# Patient Record
Sex: Female | Born: 1950 | Race: White | Hispanic: No | Marital: Married | Smoking: Former smoker
Health system: Southern US, Community
[De-identification: ages and names within clinical notes are randomized; demographics above are authoritative.]

## PROBLEM LIST (undated history)

## (undated) DIAGNOSIS — E119 Type 2 diabetes mellitus without complications: Secondary | ICD-10-CM

## (undated) DIAGNOSIS — I10 Essential (primary) hypertension: Secondary | ICD-10-CM

## (undated) DIAGNOSIS — R600 Localized edema: Secondary | ICD-10-CM

## (undated) DIAGNOSIS — R609 Edema, unspecified: Secondary | ICD-10-CM

## (undated) DIAGNOSIS — G2581 Restless legs syndrome: Secondary | ICD-10-CM

## (undated) DIAGNOSIS — N189 Chronic kidney disease, unspecified: Secondary | ICD-10-CM

## (undated) DIAGNOSIS — T884XXA Failed or difficult intubation, initial encounter: Secondary | ICD-10-CM

## (undated) DIAGNOSIS — E669 Obesity, unspecified: Secondary | ICD-10-CM

## (undated) DIAGNOSIS — N1330 Unspecified hydronephrosis: Secondary | ICD-10-CM

## (undated) DIAGNOSIS — N200 Calculus of kidney: Principal | ICD-10-CM

## (undated) DIAGNOSIS — R3129 Other microscopic hematuria: Secondary | ICD-10-CM

## (undated) DIAGNOSIS — G4730 Sleep apnea, unspecified: Secondary | ICD-10-CM

## (undated) HISTORY — DX: Edema, unspecified: R60.9

## (undated) HISTORY — DX: Type 2 diabetes mellitus without complications: E11.9

## (undated) HISTORY — PX: BACK SURGERY: SHX140

## (undated) HISTORY — PX: OOPHORECTOMY: SHX6387

## (undated) HISTORY — DX: Obesity, unspecified: E66.9

## (undated) HISTORY — PX: BARIATRIC SURGERY: SHX1103

## (undated) HISTORY — DX: Other microscopic hematuria: R31.29

## (undated) HISTORY — DX: Unspecified hydronephrosis: N13.30

## (undated) HISTORY — DX: Calculus of kidney: N20.0

## (undated) HISTORY — DX: Sleep apnea, unspecified: G47.30

## (undated) HISTORY — PX: ABDOMINAL HYSTERECTOMY: SHX81

## (undated) HISTORY — PX: OTHER SURGICAL HISTORY: SHX169

---

## 2006-04-13 ENCOUNTER — Ambulatory Visit: Payer: Self-pay | Admitting: Family Medicine

## 2007-05-12 ENCOUNTER — Emergency Department: Payer: Self-pay | Admitting: Emergency Medicine

## 2007-05-13 ENCOUNTER — Ambulatory Visit: Payer: Self-pay | Admitting: Family Medicine

## 2008-02-03 ENCOUNTER — Other Ambulatory Visit: Payer: Self-pay

## 2008-02-03 ENCOUNTER — Emergency Department: Payer: Self-pay | Admitting: Internal Medicine

## 2008-02-12 ENCOUNTER — Other Ambulatory Visit: Payer: Self-pay

## 2008-02-12 ENCOUNTER — Observation Stay: Payer: Self-pay | Admitting: Specialist

## 2008-02-27 ENCOUNTER — Ambulatory Visit: Payer: Self-pay | Admitting: Family Medicine

## 2009-03-06 ENCOUNTER — Emergency Department: Payer: Self-pay | Admitting: Emergency Medicine

## 2009-08-26 ENCOUNTER — Ambulatory Visit: Payer: Self-pay | Admitting: Internal Medicine

## 2009-09-04 ENCOUNTER — Ambulatory Visit: Payer: Self-pay | Admitting: Urology

## 2010-01-15 ENCOUNTER — Ambulatory Visit: Payer: Self-pay | Admitting: Family Medicine

## 2011-02-17 ENCOUNTER — Ambulatory Visit: Payer: Self-pay | Admitting: Otolaryngology

## 2011-04-07 ENCOUNTER — Ambulatory Visit: Payer: Self-pay | Admitting: Family Medicine

## 2011-07-26 ENCOUNTER — Ambulatory Visit: Payer: Self-pay | Admitting: Unknown Physician Specialty

## 2011-12-21 ENCOUNTER — Encounter: Payer: Self-pay | Admitting: Nurse Practitioner

## 2011-12-21 ENCOUNTER — Encounter: Payer: Self-pay | Admitting: Cardiothoracic Surgery

## 2011-12-29 ENCOUNTER — Encounter: Payer: Self-pay | Admitting: Cardiothoracic Surgery

## 2011-12-29 ENCOUNTER — Encounter: Payer: Self-pay | Admitting: Nurse Practitioner

## 2012-01-29 ENCOUNTER — Encounter: Payer: Self-pay | Admitting: Cardiothoracic Surgery

## 2012-01-29 ENCOUNTER — Encounter: Payer: Self-pay | Admitting: Nurse Practitioner

## 2012-03-22 ENCOUNTER — Ambulatory Visit: Payer: Self-pay | Admitting: Bariatrics

## 2012-03-22 DIAGNOSIS — Z01810 Encounter for preprocedural cardiovascular examination: Secondary | ICD-10-CM

## 2012-04-19 ENCOUNTER — Ambulatory Visit: Payer: Self-pay | Admitting: Internal Medicine

## 2012-04-21 ENCOUNTER — Ambulatory Visit: Payer: Self-pay | Admitting: Bariatrics

## 2012-04-30 ENCOUNTER — Ambulatory Visit: Payer: Self-pay | Admitting: Bariatrics

## 2012-06-06 ENCOUNTER — Ambulatory Visit: Payer: Self-pay | Admitting: Bariatrics

## 2012-06-13 ENCOUNTER — Inpatient Hospital Stay: Payer: Self-pay | Admitting: Bariatrics

## 2012-06-14 LAB — CBC WITH DIFFERENTIAL/PLATELET
Basophil #: 0 10*3/uL (ref 0.0–0.1)
Basophil %: 0.5 %
Eosinophil #: 0 10*3/uL (ref 0.0–0.7)
Lymphocyte %: 6.3 %
MCH: 29.5 pg (ref 26.0–34.0)
MCHC: 34.1 g/dL (ref 32.0–36.0)
MCV: 87 fL (ref 80–100)
Monocyte #: 0.7 x10 3/mm (ref 0.2–0.9)
Neutrophil %: 86 %
Platelet: 202 10*3/uL (ref 150–440)
RBC: 4.44 10*6/uL (ref 3.80–5.20)
RDW: 14.1 % (ref 11.5–14.5)

## 2012-06-14 LAB — BASIC METABOLIC PANEL
Anion Gap: 8 (ref 7–16)
BUN: 12 mg/dL (ref 7–18)
Calcium, Total: 8.3 mg/dL — ABNORMAL LOW (ref 8.5–10.1)
Chloride: 108 mmol/L — ABNORMAL HIGH (ref 98–107)
Co2: 26 mmol/L (ref 21–32)
Creatinine: 0.74 mg/dL (ref 0.60–1.30)
EGFR (African American): >60
Osmolality: 284 (ref 275–301)

## 2012-06-19 LAB — PATHOLOGY REPORT

## 2012-07-06 ENCOUNTER — Ambulatory Visit: Payer: Self-pay | Admitting: Bariatrics

## 2012-07-30 ENCOUNTER — Ambulatory Visit: Payer: Self-pay | Admitting: Bariatrics

## 2013-01-12 DIAGNOSIS — R3129 Other microscopic hematuria: Secondary | ICD-10-CM

## 2013-01-12 HISTORY — DX: Other microscopic hematuria: R31.29

## 2013-01-25 DIAGNOSIS — N200 Calculus of kidney: Secondary | ICD-10-CM | POA: Insufficient documentation

## 2013-01-25 HISTORY — DX: Calculus of kidney: N20.0

## 2013-06-20 ENCOUNTER — Ambulatory Visit: Payer: Self-pay | Admitting: Internal Medicine

## 2014-03-24 DIAGNOSIS — E119 Type 2 diabetes mellitus without complications: Secondary | ICD-10-CM | POA: Insufficient documentation

## 2014-03-24 DIAGNOSIS — E1169 Type 2 diabetes mellitus with other specified complication: Secondary | ICD-10-CM | POA: Insufficient documentation

## 2014-03-24 DIAGNOSIS — I10 Essential (primary) hypertension: Secondary | ICD-10-CM | POA: Insufficient documentation

## 2014-03-24 DIAGNOSIS — R609 Edema, unspecified: Secondary | ICD-10-CM | POA: Insufficient documentation

## 2014-03-24 DIAGNOSIS — E118 Type 2 diabetes mellitus with unspecified complications: Secondary | ICD-10-CM | POA: Insufficient documentation

## 2014-03-24 DIAGNOSIS — E669 Obesity, unspecified: Secondary | ICD-10-CM | POA: Insufficient documentation

## 2014-06-09 DIAGNOSIS — E668 Other obesity: Secondary | ICD-10-CM | POA: Insufficient documentation

## 2014-08-13 ENCOUNTER — Ambulatory Visit: Payer: Self-pay | Admitting: Internal Medicine

## 2014-10-02 ENCOUNTER — Telehealth: Payer: Self-pay | Admitting: Neurology

## 2014-12-12 DIAGNOSIS — G2581 Restless legs syndrome: Secondary | ICD-10-CM | POA: Insufficient documentation

## 2014-12-13 ENCOUNTER — Ambulatory Visit
Admit: 2014-12-13 | Disposition: A | Discharge status: Home / Self Care | Payer: Self-pay | Attending: Internal Medicine | Admitting: Internal Medicine

## 2014-12-17 NOTE — Op Note (Signed)
PATIENT NAME:  Laurie Dickson, Laurie Dickson MR#:  [id number] DATE OF BIRTH:  1951-08-13  DATE OF PROCEDURE:  06/13/2012  PROCEDURE PERFORMED: Laparoscopic gastric bypass with repair of hiatal hernia, intraoperative endoscopy.   PREOPERATIVE DIAGNOSIS: Morbid obesity with a BMI of 52 associated with hypertension and diabetes mellitus, hyperlipidemia, and presence of a hiatal hernia and reflux as noted on upper GI series.   POSTOPERATIVE DIAGNOSIS: Morbid obesity with a BMI of 52 associated with hypertension and diabetes mellitus, hyperlipidemia, and presence of a hiatal hernia and reflux as noted on upper GI series, 2-cm hiatal hernia.   SURGEON: Tyrone Apple. Alva Garnet, MD  ASSISTANT: Sanjuana Kava, PA   DESCRIPTION OF PROCEDURE: The patient was brought to the operating room and placed in the supine position. General anesthesia was obtained with orotracheal intubation. The abdomen was sterilely prepped and draped after a Foley catheter was inserted. TED hose and Thromboguards were applied and a foot board applied at the end of the operative bed. After prep and drape of the abdomen, a 5-mm Optiview trocar was introduced under direct visualization in the left upper quadrant of the abdomen. Pneumoperitoneum was obtained with carbon dioxide. Four additional trocars were introduced across the upper abdomen and the omentum elevated out of the lower abdomen and the ligament of Treitz identified. The bowel was followed distally 50 cm at which point it was divided with a white load GIA stapler. The arcade vessels were divided by use of the Harmonic scalpel. The patient then had the bowel followed an additional 100 cm at which point a side-to-side jejunal/jejunal anastomosis was created.  This was accomplished with enterotomies on the antimesenteric border of the biliary limb and the common channel portion of jejunum. A white load stapler was used to create a common lumen followed by closure of the resulting enterotomy with a repeat  firing of white load GIA stapler. Anti-torsion sutures were placed distal to the anastomosis and the mesenteric window closed with a running 2-0 Ethibond suture. The transverse colon omentum was then divided by use of the Harmonic scalpel and the mobilized Roux limb was secured to the anterior stomach. A Nathanson liver retractor was introduced through a subxiphoid wound and with the patient placed in a steep Trendelenburg position, the region in the hiatus identified. The patient was with a large fat pad in this region. It was mobilized away from the undersurface of the left hemidiaphragm and the anterior aspect of the hiatus. There was noted to be a generous amount of fat herniating through the hiatus. Portions of the gastrohepatic ligament were divided by use of the Harmonic scalpel. The peritoneum along the right crus was incised by use of the Harmonic scalpel. Next, blunt dissection was used to reduce both the herniated lesser sac fatty tissue and the herniated peritoneum anteriorly. In addition to this, a generous portion of fat was reduced from the lower mediastinum; portions of this were excised from the anterior upper stomach. The patient then had circumferential dissection of the esophagus and lower mediastinum sweeping away from the overlying pericardium, the aorta, and the pleural surfaces. This dissection was extended circumferentially into the lower mediastinum over a distance of approximately 5 to 6 cm. This ultimately resulted in delivery of approximately 1.5 cm of esophagus lying comfortably in the abdominal cavity. Next, a posterior crural repair was performed. With the third suture there was noted to be increased tension on the diaphragmatic fascia and for that reason a relaxing incision in right hemidiaphragm pursued. This was accomplished  with division of the thickened fascia at a point midway between the vena cava and the upper apex of the right crus. This led to identification of the  underlying peripleural fat pad. The release was extended in a curvilinear fashion superiorly to a point in which the fascia was noted to approximate the pericardium. This resulted in an iatrogenic defect noted to measure approximately 3.5 to 4 cm in length.  It broadened to a diameter of approximately 1.5 cm and resulted in a significant reduction of tension across the hiatal repair. Because the left lobe of the liver was overlying this area, it was felt that coverage of this should not be necessary. The patient then had division of the arcade vessels along the lesser curvature of the stomach beginning approximately 5 cm inferior to the GE junction. This was accomplished with use of the Harmonic scalpel. Next, a transverse gold load staple with Seamguard was used to initiate creation of a proximal gastric pouch. This was placed in a transverse direction. Next, a vertical line of staples was brought out just lateral to the angle of His. The patient then had a distal posterior gastrojejunal anastomosis accomplished. This was accomplished with enterotomy on the distal posterior aspect of the gastric pouch and opposing portion of the mobilized Roux limb. A small candy-cane was left to reduce tension on the mesentery. At this point, the patient had a blue load stapler used to create a common lumen with the stapler being fired at approximately the 2-cm mark. The resultant enterotomy was closed with a running 2-0 Vicryl suture while a 34 French bougie was then placed, used as an aid in sizing of the anastomotic lumen. The suture and staple lines were reinforced with additional running 2-0 Vicryl suture. With the bowel occluded distal to the anastomosis, an upper endoscopy was performed. There was no evidence of a leak noted with a saline bath. No bleeding was appreciated in the gastric pouch and/or the anastomotic area. The scope was withdrawn. An omental patch was placed over the area of the anastomosis with use of the  two prior divided limbs of the omentum. These were secured to the jejunum immediately distal to the anastomosis. Peterson defect was closed with a running 2-0 Ethibond suture. The jejunal-jejunal anastomVonita Mossotic area was inspected and noted to have excellent hemostasis.   At this point the pneumoperitoneum was relieved. The trocars were removed. The wounds were managed with 0.25% Marcaine with epinephrine, 4-0 Monocryl in the dermis followed by Dermabond.   ESTIMATED BLOOD LOSS: 25 mL or less.     ____________________________ Tyrone AppleMichael A. Alva Garnetyner, MD mat:bjt D: 06/13/2012 11:46:27 ET T: 06/13/2012 12:28:42 ET JOB#: [id number]  cc: Casimiro NeedleMichael A. Alva Garnetyner, MD, <Dictator> Marya AmslerMarshall W. Dareen PianoAnderson, MD Everette RankMICHAEL A Aaima Gaddie MD ELECTRONICALLY SIGNED 06/14/2012 8:25

## 2014-12-25 ENCOUNTER — Ambulatory Visit
Admit: 2014-12-25 | Disposition: A | Discharge status: Home / Self Care | Payer: Self-pay | Attending: Urology | Admitting: Urology

## 2015-01-15 ENCOUNTER — Encounter
Admission: RE | Admit: 2015-01-15 | Discharge: 2015-01-15 | Disposition: A | Discharge status: Home / Self Care | Payer: 59 | Source: Ambulatory Visit | Attending: Urology | Admitting: Urology

## 2015-01-15 ENCOUNTER — Encounter: Payer: Self-pay | Admitting: *Deleted

## 2015-01-15 DIAGNOSIS — E1129 Type 2 diabetes mellitus with other diabetic kidney complication: Secondary | ICD-10-CM | POA: Diagnosis not present

## 2015-01-15 DIAGNOSIS — Z9884 Bariatric surgery status: Secondary | ICD-10-CM | POA: Diagnosis not present

## 2015-01-15 DIAGNOSIS — G2581 Restless legs syndrome: Secondary | ICD-10-CM | POA: Diagnosis not present

## 2015-01-15 DIAGNOSIS — N189 Chronic kidney disease, unspecified: Secondary | ICD-10-CM | POA: Diagnosis not present

## 2015-01-15 DIAGNOSIS — Z79899 Other long term (current) drug therapy: Secondary | ICD-10-CM | POA: Diagnosis not present

## 2015-01-15 DIAGNOSIS — Z9889 Other specified postprocedural states: Secondary | ICD-10-CM | POA: Diagnosis not present

## 2015-01-15 DIAGNOSIS — F329 Major depressive disorder, single episode, unspecified: Secondary | ICD-10-CM | POA: Diagnosis not present

## 2015-01-15 DIAGNOSIS — N201 Calculus of ureter: Secondary | ICD-10-CM | POA: Diagnosis not present

## 2015-01-15 DIAGNOSIS — N200 Calculus of kidney: Secondary | ICD-10-CM | POA: Insufficient documentation

## 2015-01-15 DIAGNOSIS — Z87891 Personal history of nicotine dependence: Secondary | ICD-10-CM | POA: Diagnosis not present

## 2015-01-15 DIAGNOSIS — N211 Calculus in urethra: Secondary | ICD-10-CM | POA: Diagnosis not present

## 2015-01-15 DIAGNOSIS — Z01812 Encounter for preprocedural laboratory examination: Secondary | ICD-10-CM | POA: Insufficient documentation

## 2015-01-15 DIAGNOSIS — Z882 Allergy status to sulfonamides status: Secondary | ICD-10-CM | POA: Diagnosis not present

## 2015-01-15 DIAGNOSIS — I129 Hypertensive chronic kidney disease with stage 1 through stage 4 chronic kidney disease, or unspecified chronic kidney disease: Secondary | ICD-10-CM | POA: Diagnosis not present

## 2015-01-15 DIAGNOSIS — Z90710 Acquired absence of both cervix and uterus: Secondary | ICD-10-CM | POA: Diagnosis not present

## 2015-01-15 LAB — BASIC METABOLIC PANEL
ANION GAP: 6 (ref 5–15)
BUN: 21 mg/dL — ABNORMAL HIGH (ref 6–20)
CO2: 28 mmol/L (ref 22–32)
Calcium: 8.8 mg/dL — ABNORMAL LOW (ref 8.9–10.3)
Chloride: 106 mmol/L (ref 101–111)
Creatinine, Ser: 0.62 mg/dL (ref 0.44–1.00)
GFR calc Af Amer: >60 mL/min (ref >60)
Glucose, Bld: 87 mg/dL (ref 65–99)
POTASSIUM: 4.7 mmol/L (ref 3.5–5.1)
SODIUM: 140 mmol/L (ref 135–145)

## 2015-01-15 LAB — CBC
HCT: 38.2 % (ref 35.0–47.0)
Hemoglobin: 12.5 g/dL (ref 12.0–16.0)
MCH: 28.8 pg (ref 26.0–34.0)
MCHC: 32.6 g/dL (ref 32.0–36.0)
MCV: 88.4 fL (ref 80.0–100.0)
PLATELETS: 173 10*3/uL (ref 150–440)
RBC: 4.32 MIL/uL (ref 3.80–5.20)
RDW: 14.2 % (ref 11.5–14.5)
WBC: 4.9 10*3/uL (ref 3.6–11.0)

## 2015-01-15 NOTE — Patient Instructions (Signed)
  Your procedure is scheduled on: 01/20/15 Report to Day Surgery. To find out your arrival time please call [phone] between 1PM - 3PM on 01/17/15.  Remember: Instructions that are not followed completely may result in serious medical risk, up to and including death, or upon the discretion of your surgeon and anesthesiologist your surgery may need to be rescheduled.    _x___ 1. Do not eat food or drink liquids after midnight. No gum chewing or hard candies.     __x__ 2. No Alcohol for 24 hours before or after surgery.   ____ 3. Bring all medications with you on the day of surgery if instructed.    __x__ 4. Notify your doctor if there is any change in your medical condition     (cold, fever, infections).     Do not wear jewelry, make-up, hairpins, clips or nail polish.  Do not wear lotions, powders, or perfumes. You may wear deodorant.  Do not shave 48 hours prior to surgery. Men may shave face and neck.  Do not bring valuables to the hospital.    [facility] is [facility] responsible for any belongings or valuables.               Contacts, dentures or bridgework may not be worn into surgery.  Leave your suitcase in the car. After surgery it may be brought to your room.  For patients admitted to the hospital, discharge time is determined by your                treatment team.   Patients discharged the day of surgery will not be allowed to drive home.   Please read over the following fact sheets that you were given:   Surgical Site Infection Prevention   ____ Take these medicines the morning of surgery with A SIP OF WATER:    1.   2.   3.   4.  5.  6.  ____ Fleet Enema (as directed)   ____ Use CHG Soap as directed  ____ Use inhalers on the day of surgery  ____ Stop metformin 2 days prior to surgery    ____ Take 1/2 of usual insulin dose the night before surgery and none on the morning of surgery.   ____ Stop Coumadin/Plavix/aspirin on   ____ Stop Anti-inflammatories on     ____ Stop supplements until after surgery.    ____ Bring C-Pap to the hospital.

## 2015-01-20 ENCOUNTER — Encounter
Admission: RE | Disposition: A | Discharge status: Home / Self Care | Payer: Self-pay | Source: Ambulatory Visit | Attending: Urology

## 2015-01-20 ENCOUNTER — Ambulatory Visit: Payer: 59 | Admitting: Anesthesiology

## 2015-01-20 ENCOUNTER — Ambulatory Visit
Admission: RE | Admit: 2015-01-20 | Discharge: 2015-01-20 | Disposition: A | Discharge status: Home / Self Care | Payer: 59 | Source: Ambulatory Visit | Attending: Urology | Admitting: Urology

## 2015-01-20 ENCOUNTER — Encounter: Payer: Self-pay | Admitting: Anesthesiology

## 2015-01-20 DIAGNOSIS — N189 Chronic kidney disease, unspecified: Secondary | ICD-10-CM | POA: Insufficient documentation

## 2015-01-20 DIAGNOSIS — Z90710 Acquired absence of both cervix and uterus: Secondary | ICD-10-CM | POA: Insufficient documentation

## 2015-01-20 DIAGNOSIS — Z79899 Other long term (current) drug therapy: Secondary | ICD-10-CM | POA: Insufficient documentation

## 2015-01-20 DIAGNOSIS — Z9884 Bariatric surgery status: Secondary | ICD-10-CM | POA: Insufficient documentation

## 2015-01-20 DIAGNOSIS — F329 Major depressive disorder, single episode, unspecified: Secondary | ICD-10-CM | POA: Insufficient documentation

## 2015-01-20 DIAGNOSIS — N201 Calculus of ureter: Secondary | ICD-10-CM | POA: Insufficient documentation

## 2015-01-20 DIAGNOSIS — E1129 Type 2 diabetes mellitus with other diabetic kidney complication: Secondary | ICD-10-CM | POA: Insufficient documentation

## 2015-01-20 DIAGNOSIS — Z9889 Other specified postprocedural states: Secondary | ICD-10-CM | POA: Insufficient documentation

## 2015-01-20 DIAGNOSIS — I129 Hypertensive chronic kidney disease with stage 1 through stage 4 chronic kidney disease, or unspecified chronic kidney disease: Secondary | ICD-10-CM | POA: Insufficient documentation

## 2015-01-20 DIAGNOSIS — Z882 Allergy status to sulfonamides status: Secondary | ICD-10-CM | POA: Insufficient documentation

## 2015-01-20 DIAGNOSIS — Z87891 Personal history of nicotine dependence: Secondary | ICD-10-CM | POA: Insufficient documentation

## 2015-01-20 DIAGNOSIS — G2581 Restless legs syndrome: Secondary | ICD-10-CM | POA: Insufficient documentation

## 2015-01-20 HISTORY — DX: Chronic kidney disease, unspecified: N18.9

## 2015-01-20 HISTORY — PX: CYSTOSCOPY W/ RETROGRADES: SHX1426

## 2015-01-20 HISTORY — DX: Localized edema: R60.0

## 2015-01-20 HISTORY — DX: Type 2 diabetes mellitus without complications: E11.9

## 2015-01-20 HISTORY — PX: CYSTOSCOPY WITH STENT PLACEMENT: SHX5790

## 2015-01-20 HISTORY — DX: Essential (primary) hypertension: I10

## 2015-01-20 HISTORY — PX: URETEROSCOPY WITH HOLMIUM LASER LITHOTRIPSY: SHX6645

## 2015-01-20 HISTORY — DX: Restless legs syndrome: G25.81

## 2015-01-20 SURGERY — URETEROSCOPY, WITH LITHOTRIPSY USING HOLMIUM LASER
Anesthesia: General

## 2015-01-20 MED ORDER — GLYCOPYRROLATE 0.2 MG/ML IJ SOLN
INTRAMUSCULAR | Status: DC | PRN
  Administered 2015-01-20: 0.2 mg via INTRAVENOUS

## 2015-01-20 MED ORDER — PROPOFOL 10 MG/ML IV BOLUS
INTRAVENOUS | Status: DC | PRN
  Administered 2015-01-20: 200 mg via INTRAVENOUS

## 2015-01-20 MED ORDER — ONDANSETRON HCL 4 MG/2ML IJ SOLN
INTRAMUSCULAR | Status: DC | PRN
  Administered 2015-01-20: 4 mg via INTRAVENOUS

## 2015-01-20 MED ORDER — OXYCODONE-ACETAMINOPHEN 5-325 MG PO TABS: 1.0000 | ORAL_TABLET | Freq: Four times a day (QID) | ORAL | Status: DC | PRN

## 2015-01-20 MED ORDER — FENTANYL CITRATE (PF) 100 MCG/2ML IJ SOLN
INTRAMUSCULAR | Status: DC | PRN
  Administered 2015-01-20: 50 ug via INTRAVENOUS

## 2015-01-20 MED ORDER — EPHEDRINE SULFATE 50 MG/ML IJ SOLN
INTRAMUSCULAR | Status: DC | PRN
  Administered 2015-01-20 (×2): 5 mg via INTRAVENOUS

## 2015-01-20 MED ORDER — LIDOCAINE HCL (CARDIAC) 20 MG/ML IV SOLN
INTRAVENOUS | Status: DC | PRN
  Administered 2015-01-20: 50 mg via INTRAVENOUS

## 2015-01-20 MED ORDER — FENTANYL CITRATE (PF) 100 MCG/2ML IJ SOLN: 25.0000 ug | INTRAMUSCULAR | Status: DC | PRN

## 2015-01-20 MED ORDER — BUPIVACAINE HCL (PF) 0.5 % IJ SOLN
INTRAMUSCULAR | Status: AC
  Filled 2015-01-20: qty 30

## 2015-01-20 MED ORDER — MIDAZOLAM HCL 2 MG/2ML IJ SOLN
INTRAMUSCULAR | Status: DC | PRN
  Administered 2015-01-20: 2 mg via INTRAVENOUS

## 2015-01-20 MED ORDER — IOTHALAMATE MEGLUMINE 43 % IV SOLN
INTRAVENOUS | Status: DC | PRN
  Administered 2015-01-20: 1 g via RECTAL

## 2015-01-20 MED ORDER — CEFAZOLIN SODIUM 1-5 GM-% IV SOLN
1.0000 g | Freq: Once | INTRAVENOUS | Status: AC
  Administered 2015-01-20: 1 g via INTRAVENOUS

## 2015-01-20 MED ORDER — ONDANSETRON HCL 4 MG/2ML IJ SOLN: 4.0000 mg | Freq: Once | INTRAMUSCULAR | Status: DC | PRN

## 2015-01-20 MED ORDER — BELLADONNA ALKALOIDS-OPIUM 16.2-60 MG RE SUPP
RECTAL | Status: AC
  Filled 2015-01-20: qty 1

## 2015-01-20 MED ORDER — LACTATED RINGERS IV SOLN
INTRAVENOUS | Status: DC
  Administered 2015-01-20 (×2): via INTRAVENOUS

## 2015-01-20 MED ORDER — CEFAZOLIN SODIUM 1-5 GM-% IV SOLN
INTRAVENOUS | Status: AC
  Administered 2015-01-20: 1 g via INTRAVENOUS
  Filled 2015-01-20: qty 50

## 2015-01-20 SURGICAL SUPPLY — 29 items
BAG DRAIN CYSTO-URO LG1000N (MISCELLANEOUS) ×4 IMPLANT
CATH URETL 5X70 OPEN END (CATHETERS) ×4 IMPLANT
CNTNR SPEC 2.5X3XGRAD LEK (MISCELLANEOUS) ×2
CONRAY 43 FOR UROLOGY 50M (MISCELLANEOUS) ×4 IMPLANT
CONT SPEC 4OZ STER OR WHT (MISCELLANEOUS) ×2
CONTAINER SPEC 2.5X3XGRAD LEK (MISCELLANEOUS) ×2 IMPLANT
FEE TECHNICIAN ONLY PER HOUR (MISCELLANEOUS) ×4 IMPLANT
GLOVE BIO SURGEON STRL SZ7 (GLOVE) ×8 IMPLANT
GLOVE BIO SURGEON STRL SZ7.5 (GLOVE) ×4 IMPLANT
GOWN STRL REUS W/ TWL LRG LVL3 (GOWN DISPOSABLE) ×2 IMPLANT
GOWN STRL REUS W/ TWL XL LVL3 (GOWN DISPOSABLE) ×2 IMPLANT
GOWN STRL REUS W/TWL LRG LVL3 (GOWN DISPOSABLE) ×2
GOWN STRL REUS W/TWL XL LVL3 (GOWN DISPOSABLE) ×2
GUIDEWIRE STR ZIPWIRE 035X150 (MISCELLANEOUS) ×4 IMPLANT
INTRODUCER DILATOR DOUBLE (INTRODUCER) ×4 IMPLANT
JELLY LUB 2OZ STRL (MISCELLANEOUS) ×2
JELLY LUBE 2OZ STRL (MISCELLANEOUS) ×2 IMPLANT
KIT RM TURNOVER CYSTO AR (KITS) ×4 IMPLANT
PACK CYSTO AR (MISCELLANEOUS) ×4 IMPLANT
PREP PVP WINGED SPONGE (MISCELLANEOUS) ×4 IMPLANT
SENSORWIRE 0.038 NOT ANGLED (WIRE) ×4
SET CYSTO W/LG BORE CLAMP LF (SET/KITS/TRAYS/PACK) ×4 IMPLANT
SOL .9 NS 3000ML IRR  AL (IV SOLUTION) ×2
SOL .9 NS 3000ML IRR UROMATIC (IV SOLUTION) ×2 IMPLANT
SOL PREP PVP 2OZ (MISCELLANEOUS) ×4
SOLUTION PREP PVP 2OZ (MISCELLANEOUS) ×2 IMPLANT
STENT URET 6FRX24 CONTOUR (STENTS) ×4 IMPLANT
WATER STERILE IRR 1000ML POUR (IV SOLUTION) ×4 IMPLANT
WIRE SENSOR 0.038 NOT ANGLED (WIRE) ×2 IMPLANT

## 2015-01-20 NOTE — Anesthesia Preprocedure Evaluation (Signed)
Anesthesia Evaluation    Airway Mallampati: II  TM Distance: >3 FB     Dental  (+) Chipped, Poor Dentition   Pulmonary former smoker,          Cardiovascular hypertension,     Neuro/Psych    GI/Hepatic   Endo/Other  diabetes  Renal/GU      Musculoskeletal   Abdominal   Peds  Hematology   Anesthesia Other Findings Several missing teeth.  Reproductive/Obstetrics                             Anesthesia Physical Anesthesia Plan  ASA: III  Anesthesia Plan: General   Post-op Pain Management:    Induction: Intravenous  Airway Management Planned: LMA  Additional Equipment:   Intra-op Plan:   Post-operative Plan:   Informed Consent:   Plan Discussed with: CRNA  Anesthesia Plan Comments:         Anesthesia Quick Evaluation

## 2015-01-20 NOTE — Transfer of Care (Signed)
Immediate Anesthesia Transfer of Care Note  Patient: Evalee MuttonCathy S Saxer  Procedure(s) Performed: Procedure(s): URETEROSCOPY WITH HOLMIUM LASER LITHOTRIPSY (Left) CYSTOSCOPY WITH RETROGRADE PYELOGRAM (Left) CYSTOSCOPY WITH STENT PLACEMENT (N/A)  Patient Location: PACU  Anesthesia Type:General  Level of Consciousness: Alert, Awake, Oriented  Airway & Oxygen Therapy: Patient Spontanous Breathing  Post-op Assessment: Report given to RN  Post vital signs: Reviewed and stable  Last Vitals:  Filed Vitals:   01/20/15 1145  BP: 138/73  Pulse: 61  Temp: 36.4 C  Resp: 12    Complications: No apparent anesthesia complications

## 2015-01-20 NOTE — Anesthesia Procedure Notes (Signed)
Procedure Name: LMA Insertion Date/Time: 01/20/2015 11:15 AM Performed by: Sherron FlemingsHARVEY, Parnika Tweten Pre-anesthesia Checklist: Patient identified, Patient being monitored, Timeout performed, Emergency Drugs available and Suction available Patient Re-evaluated:Patient Re-evaluated prior to inductionOxygen Delivery Method: Circle system utilized Preoxygenation: Pre-oxygenation with 100% oxygen Intubation Type: IV induction Ventilation: Mask ventilation without difficulty LMA: LMA inserted LMA Size: 3.0 Tube type: Oral Number of attempts: 1 Placement Confirmation: positive ETCO2 and breath sounds checked- equal and bilateral Tube secured with: Tape Dental Injury: Teeth and Oropharynx as per pre-operative assessment

## 2015-01-20 NOTE — H&P (Signed)
Urology Admission H&P  Chief Complaint:   History of Present Illness:   Past Medical History  Diagnosis Date  . RLS (restless legs syndrome)   . Depression   . Hypertension   . Chronic kidney disease     stones,hydronephrosis  . Edema extremities   . Diabetes mellitus without complication     Resolved since bariatric surgery   Past Surgical History  Procedure Laterality Date  . Abdominal hysterectomy    . Back surgery    . Bariatric surgery    . Oophorectomy    . Salpingectomy      Home Medications:  Prescriptions prior to admission  Medication Sig Dispense Refill Last Dose  . rOPINIRole (REQUIP) 0.5 MG tablet Take 0.5 mg by mouth once. 3 tabs hs   01/19/2015 at Unknown time   Allergies:  Allergies  Allergen Reactions  . Sulfa Antibiotics     History reviewed. No pertinent family history. Social History:  reports that she quit smoking about 4 years ago. She does not have any smokeless tobacco history on file. She reports that she does not drink alcohol. Her drug history is not on file.  ROS  Physical Exam:  Vital signs in last 24 hours: Temp:  [97.9 F (36.6 C)] 97.9 F (36.6 C) (05/23 1012) Resp:  [16] 16 (05/23 1012) BP: (184)/(74) 184/74 mmHg (05/23 1012) SpO2:  [100 %] 100 % (05/23 1012) Weight:  [91.173 kg (201 lb)] 91.173 kg (201 lb) (05/23 1012) Physical ExamLungs cta Heart sounds RRR  Laboratory Data:  No results found for this or any previous visit (from the past 24 hour(s)). No results found for this or any previous visit (from the past 240 hour(s)). Creatinine:  Recent Labs  01/15/15 1551  CREATININE 0.62   Baseline Creatinine:   Impression/Assessment  Left ureteral and renal calculi  Plan left ureteroscopy and laser lithotripsy  Plan:   Lorraine LaxHart, Richard D 01/20/2015, 10:39 AM

## 2015-01-20 NOTE — Anesthesia Postprocedure Evaluation (Signed)
  Anesthesia Post-op Note  Patient: Laurie Dickson  Procedure(s) Performed: Procedure(s): URETEROSCOPY WITH HOLMIUM LASER LITHOTRIPSY (Left) CYSTOSCOPY WITH RETROGRADE PYELOGRAM (Left) CYSTOSCOPY WITH STENT PLACEMENT (N/A)  Anesthesia type:General  Patient location: PACU  Post pain: Pain level controlled  Post assessment: Post-op Vital signs reviewed, Patient's Cardiovascular Status Stable, Respiratory Function Stable, Patent Airway and No signs of Nausea or vomiting  Post vital signs: Reviewed and stable  Last Vitals:  Filed Vitals:   01/20/15 1215  BP:   Pulse: 54  Temp:   Resp: 16    Level of consciousness: awake, alert  and patient cooperative  Complications: No apparent anesthesia complications

## 2015-01-20 NOTE — Discharge Instructions (Signed)
AMBULATORY SURGERY  DISCHARGE INSTRUCTIONS   1) The drugs that you were given will stay in your system until tomorrow so for the next 24 hours you should not:  A) Drive an automobile B) Make any legal decisions C) Drink any alcoholic beverage   2) You may resume regular meals tomorrow.  Today it is better to start with liquids and gradually work up to solid foods.  You may eat anything you prefer, but it is better to start with liquids, then soup and crackers, and gradually work up to solid foods.   3) Please notify your doctor immediately if you have any unusual bleeding, trouble breathing, redness and pain at the surgery site, drainage, fever, or pain not relieved by medication. 4)   5) Your post-operative visit with Dr.    Druscilla Brownie                 Cystoscopy is a procedure that is used to help your caregiver diagnose and sometimes treat conditions that affect your lower urinary tract. Your lower urinary tract includes your bladder and the tube through which urine passes from your bladder out of your body (urethra). Cystoscopy is performed with a thin, tube-shaped instrument (cystoscope). The cystoscope has lenses and a light at the end so that your caregiver can see inside your bladder. The cystoscope is inserted at the entrance of your urethra. Your caregiver guides it through your urethra and into your bladder. There are two main types of cystoscopy:  Flexible cystoscopy (with a flexible cystoscope).  Rigid cystoscopy (with a rigid cystoscope). Cystoscopy may be recommended for many conditions, including:  Urinary tract infections.  Blood in your urine (hematuria).  Loss of bladder control (urinary incontinence) or overactive bladder.  Unusual cells found in a urine sample.  Urinary blockage.  Painful urination. Cystoscopy may also be done to remove a sample of your tissue to be checked under a microscope (biopsy). It may also be done to remove or destroy bladder  stones. LET YOUR CAREGIVER KNOW ABOUT:  Allergies to food or medicine.  Medicines taken, including vitamins, herbs, eyedrops, over-the-counter medicines, and creams.  Use of steroids (by mouth or creams).  Previous problems with anesthetics or numbing medicines.  History of bleeding problems or blood clots.  Previous surgery.  Other health problems, including diabetes and kidney problems.  Possibility of pregnancy, if this applies. PROCEDURE The area around the opening to your urethra will be cleaned. A medicine to numb your urethra (local anesthetic) is used. If a tissue sample or stone is removed during the procedure, you may be given a medicine to make you sleep (general anesthetic). Your caregiver will gently insert the tip of the cystoscope into your urethra. The cystoscope will be slowly glided through your urethra and into your bladder. Sterile fluid will flow through the cystoscope and into your bladder. The fluid will expand and stretch your bladder. This gives your caregiver a better view of your bladder walls. The procedure lasts about 15-20 minutes. AFTER THE PROCEDURE If a local anesthetic is used, you will be allowed to go home as soon as you are ready. If a general anesthetic is used, you will be taken to a recovery area until you are stable. You may have temporary bleeding and burning on urination. Document Released: 08/13/2000 Document Revised: 05/10/2012 Document Reviewed: 02/07/2012 [facility] Patient Information 2015 [place], [place]. This information is not intended to replace advice given to you by your health care provider. Make sure you discuss any  questions you have with your health care provider.                 is: Date:                        Time:    Please call to schedule your post-operative visit.  6) Additional Instructions: 7)

## 2015-01-20 NOTE — Op Note (Signed)
Preop ureteral calculous Postop renal calculous Procedure  Cysto, left retrograde pyelogram,Laser lithotripsy ureteroscopy stent  Anes: general  With the patient sterile draped,in the supine lithotomy for easeof approach to the external genitalia we begin the procedure.  A time-out is taken and then with a 21FR Cystoscope shealth we ender the bladder.  30 degree lens is utilized.  left retrograde is done utilizing a 565fr open ended catheter and omnipaq contrast  No filling defect is seen in the ureter .and the kidney is easily seen with contrast.  A sensor wire is put up thru the 585fr catheter but does not go easily into the kidney I withdraw it and put contrast up the ureteral catheter.  Extrasation is seen.  A rigid scope is put up over the glide wire and a caculous is seen obstructing the ureter. I then visually put the wire by the calculous and proceed to use the 360 micron laser to disintigrate the calculous the scope then goes to the kidney.  24CM stent 526fr stent is placed.  Bladder is emptied and the stent is seen in good position by cysto.  Band O suppository is in the rectum and 30ml of marcaine in the bladder. Patient is sent to recovery in satisfactory condition

## 2015-01-21 ENCOUNTER — Encounter: Payer: Self-pay | Admitting: Urology

## 2015-01-23 ENCOUNTER — Encounter: Payer: Self-pay | Admitting: *Deleted

## 2015-01-23 ENCOUNTER — Other Ambulatory Visit: Payer: Self-pay | Admitting: *Deleted

## 2015-01-28 ENCOUNTER — Encounter: Payer: Self-pay | Admitting: Urology

## 2015-01-31 ENCOUNTER — Ambulatory Visit (INDEPENDENT_AMBULATORY_CARE_PROVIDER_SITE_OTHER): Payer: 59 | Admitting: Urology

## 2015-01-31 ENCOUNTER — Encounter: Payer: Self-pay | Admitting: Urology

## 2015-01-31 VITALS — BP 146/80 | HR 65 | Ht 66.0 in | Wt 201.7 lb

## 2015-01-31 DIAGNOSIS — N200 Calculus of kidney: Secondary | ICD-10-CM

## 2015-01-31 DIAGNOSIS — N3001 Acute cystitis with hematuria: Secondary | ICD-10-CM

## 2015-01-31 LAB — MICROSCOPIC EXAMINATION
RBC, UA: >30 /HPF — AB
WBC, UA: >30 /HPF — AB

## 2015-01-31 LAB — URINALYSIS, COMPLETE
BILIRUBIN UA: NEGATIVE
GLUCOSE, UA: NEGATIVE
KETONES UA: NEGATIVE
Nitrite, UA: POSITIVE — AB
Urobilinogen, Ur: 0.2 mg/dL (ref 0.2–1.0)
pH, UA: 5.5 (ref 5.0–7.5)

## 2015-01-31 MED ORDER — CEFUROXIME AXETIL 250 MG PO TABS: 250.0000 mg | ORAL_TABLET | Freq: Two times a day (BID) | ORAL | Status: DC

## 2015-01-31 MED ORDER — CIPROFLOXACIN HCL 500 MG PO TABS: 500.0000 mg | ORAL_TABLET | Freq: Once | ORAL | Status: DC

## 2015-01-31 NOTE — Progress Notes (Signed)
01/31/2015 12:26 PM   Laurie Dickson 09/24/1950 [id number]  Referring provider: Lauro RegulusMarshall W Anderson, MD [address] [place], [place] [id number]  Chief Complaint  Patient presents with  . Nephrolithiasis    Cysto Stent Removal    HPI: Laurie Dickson is here for stent removal 2 weeks postop from a upper right ureteral calculus. She is nitrate positive today so I will keep her on antibiotics for another week and put her on a put her on for removal of her stent on Thursday morning or Thursday after patient was disappointed that this is. Urine has been cultured and I'll place her on name broad-spectrum antibiotics such as Ceftin or possibly Cipro   PMH: Past Medical History  Diagnosis Date  . RLS (restless legs syndrome)   . Depression   . Hypertension   . Chronic kidney disease     stones,hydronephrosis  . Edema extremities   . Diabetes mellitus without complication     Resolved since bariatric surgery  . Panic attacks   . Sleep apnea   . Obesity   . Depression   . Kidney stone   . Edema   . Diabetes   . Hydronephrosis     Surgical History: Past Surgical History  Procedure Laterality Date  . Abdominal hysterectomy    . Back surgery    . Bariatric surgery    . Oophorectomy    . Salpingectomy    . Ureteroscopy with holmium laser lithotripsy Left 01/20/2015    Procedure: URETEROSCOPY WITH HOLMIUM LASER LITHOTRIPSY;  Surgeon: Lorraine Laxichard D Tayvin Preslar, MD;  Location: [facility] ORS;  Service: Urology;  Laterality: Left;  . Cystoscopy w/ retrogrades Left 01/20/2015    Procedure: CYSTOSCOPY WITH RETROGRADE PYELOGRAM;  Surgeon: Lorraine Laxichard D Navraj Dreibelbis, MD;  Location: [facility] OR[facility];  Service: Urology;  Laterality: Left;  . Cystoscopy with stent placement N/A 01/20/2015    Procedure: CYSTOSCOPY WITH STENT PLACEMENT;  Surgeon: Lorraine Laxichard D Shekera Beavers, MD;  Location: [facility] OR[facility];  Service: Urology;  Laterality: N/A;    Home Medications:    Medication List       This list is accurate as of: 01/31/15 12:26 PM.   Always use your most recent med list.               omeprazole 20 MG capsule  Commonly known as:  PRILOSEC  Take by mouth.     oxyCODONE-acetaminophen 5-325 MG per tablet  Commonly known as:  ROXICET  Take 1 tablet by mouth every 6 (six) hours as needed for severe pain.     rOPINIRole 0.5 MG tablet  Commonly known as:  REQUIP  Take 0.5 mg by mouth once. 3 tabs hs        Allergies:  Allergies  Allergen Reactions  . Sulfa Antibiotics     Family History: Family History  Problem Relation Age of Onset  . Kidney cancer Father     Social History:  reports that she quit smoking about 4 years ago. She does not have any smokeless tobacco history on file. She reports that she does not drink alcohol. Her drug history is not on file.  ROS: Urological Symptom Review  Patient is experiencing the following symptoms: Blood in urine Injury to kidneys/bladder   Review of Systems  Gastrointestinal (upper)  : Negative for upper GI symptoms  Gastrointestinal (lower) : Constipation  Constitutional : Negative for symptoms  Skin: Negative for skin symptoms  Eyes: Negative for eye symptoms  Ear/Nose/Throat : Negative for Ear/Nose/Throat symptoms  Hematologic/Lymphatic: Negative for Hematologic/Lymphatic symptoms  Cardiovascular : Leg swelling  Respiratory : negative  Endocrine: Negative for endocrine symptoms  Musculoskeletal: Negative for musculoskeletal symptoms  Neurological: Negative for neurological symptoms  Psychologic: Depression Anxiety   Physical Exam: BP 146/80 mmHg  Pulse 65  Ht  (1.676 m)  Wt 201 lb 11.2 oz (91.491 kg)  BMI 32.57 kg/m2  Constitutional:  Alert and oriented, No acute distress. HEENT: [place] AT, moist mucus membranes.  Trachea midline, no masses. Cardiovascular: No clubbing, cyanosis, or edema. Respiratory: Normal respiratory effort, no increased work of breathing. GI: Abdomen is soft, nontender, nondistended, no  abdominal masses GU: No CVA tenderness. Abdomen is soft she has some suprapubic tenderness. Skin: No rashes, bruises or suspicious lesions. Lymph: No cervical or inguinal adenopathy. Neurologic: Grossly intact, no focal deficits, moving all 4 extremities. Psychiatric: Normal mood and affect.  Laboratory Data: Lab Results  Component Value Date   WBC 4.9 01/15/2015   HGB 12.5 01/15/2015   HCT 38.2 01/15/2015   MCV 88.4 01/15/2015   PLT 173 01/15/2015    Lab Results  Component Value Date   CREATININE 0.62 01/15/2015    No results found for: PSA  No results found for: TESTOSTERONE  No results found for: HGBA1C  Urinalysis No results found for: COLORURINE, APPEARANCEUR, LABSPEC, PHURINE, GLUCOSEU, HGBUR, BILIRUBINUR, KETONESUR, PROTEINUR, UROBILINOGEN, NITRITE, LEUKOCYTESUR  Pertinent Imaging: No pertinent imaging is necessary  Assessment & Plan:  I will place her on antibiotics and see her next Thursday for removal of her stent after a urinalysis  1. Calculus of kidney urinary tract infection - Urinalysis, Complete - CULTURE, URINE COMPREHENSIVE   No Follow-up on file.  [facility] [address], [address] [place], [place] [address] [phone]

## 2015-02-02 LAB — CULTURE, URINE COMPREHENSIVE

## 2015-02-03 ENCOUNTER — Telehealth: Payer: Self-pay | Admitting: *Deleted

## 2015-02-03 DIAGNOSIS — N189 Chronic kidney disease, unspecified: Secondary | ICD-10-CM

## 2015-02-03 MED ORDER — CIPROFLOXACIN HCL 500 MG PO TABS: 500.0000 mg | ORAL_TABLET | Freq: Two times a day (BID) | ORAL | Status: DC

## 2015-02-03 NOTE — Telephone Encounter (Signed)
Spoke with pt and relayed Rx info. for Ciprofloxacin (results of her recent lab indicated immunity to current antibiotic Ceftin).  Pt understood and will pick-up/start Cipro.

## 2015-02-03 NOTE — Telephone Encounter (Signed)
LVM on both home and cell #'s with prescription information and a request for a call-back with questions.

## 2015-02-03 NOTE — Telephone Encounter (Signed)
-----   Message from Harle BattiestShannon A McGowan, PA-C sent at 02/03/2015  8:24 AM EDT ----- Please call the patient to see what antibiotic Dr. Edwyna ShellHart put the patient on.

## 2015-02-06 ENCOUNTER — Ambulatory Visit (INDEPENDENT_AMBULATORY_CARE_PROVIDER_SITE_OTHER): Payer: 59 | Admitting: Urology

## 2015-02-06 ENCOUNTER — Encounter: Payer: Self-pay | Admitting: Urology

## 2015-02-06 VITALS — BP 147/80 | HR 69 | Ht 66.0 in | Wt 202.3 lb

## 2015-02-06 DIAGNOSIS — N200 Calculus of kidney: Secondary | ICD-10-CM | POA: Diagnosis not present

## 2015-02-06 DIAGNOSIS — N132 Hydronephrosis with renal and ureteral calculous obstruction: Secondary | ICD-10-CM

## 2015-02-06 LAB — URINALYSIS, COMPLETE
Bilirubin, UA: NEGATIVE
Glucose, UA: NEGATIVE
KETONES UA: NEGATIVE
Nitrite, UA: NEGATIVE
UUROB: 0.2 mg/dL (ref 0.2–1.0)
pH, UA: 5 (ref 5.0–7.5)

## 2015-02-06 LAB — MICROSCOPIC EXAMINATION

## 2015-02-06 MED ORDER — LIDOCAINE HCL 2 % EX GEL
1.0000 "application " | Freq: Once | CUTANEOUS | Status: AC
  Administered 2015-02-06: 1 via URETHRAL

## 2015-02-06 NOTE — Progress Notes (Signed)
10:57 AM   Laurie Dickson Apr 19, 1951 [id number]  Referring provider: Lauro Regulus, MD [address] [place], [place] [address]  Chief Complaint  Patient presents with  . Nephrolithiasis    Cysto Stent Removal    HPI: 64 year old female status post left ureteroscopy, laser lithotripsy, and stent placement by Dr. Edwyna Shell on 01/20/2015.  He initially presented last week for ureteral stent removal however her UA was nitrate positive and ultimately 100 K Enterobacter was indeed sensitive to the Cipro, which she was started 1 week ago.  Today, she denies any fevers or chills. She denies any flank pain. Her UA appears remarkably improved from last week. She is anxious to have her stent removed today.  PMH: Past Medical History  Diagnosis Date  . RLS (restless legs syndrome)   . Depression   . Hypertension   . Chronic kidney disease     stones,hydronephrosis  . Edema extremities   . Diabetes mellitus without complication     Resolved since bariatric surgery  . Panic attacks   . Sleep apnea   . Obesity   . Depression   . Kidney stone   . Edema   . Diabetes   . Hydronephrosis     Surgical History: Past Surgical History  Procedure Laterality Date  . Abdominal hysterectomy    . Back surgery    . Bariatric surgery    . Oophorectomy    . Salpingectomy    . Ureteroscopy with holmium laser lithotripsy Left 01/20/2015    Procedure: URETEROSCOPY WITH HOLMIUM LASER LITHOTRIPSY;  Surgeon: Lorraine Lax, MD;  Location: [facility];  Service: Urology;  Laterality: Left;  . Cystoscopy w/ retrogrades Left 01/20/2015    Procedure: CYSTOSCOPY WITH RETROGRADE PYELOGRAM;  Surgeon: Lorraine Lax, MD;  Location: [facility];  Service: Urology;  Laterality: Left;  . Cystoscopy with stent placement N/A 01/20/2015    Procedure: CYSTOSCOPY WITH STENT PLACEMENT;  Surgeon: Lorraine Lax, MD;  Location: [facility];  Service: Urology;  Laterality: N/A;    Home Medications:    Medication List        This list is accurate as of: 02/06/15 10:57 AM.  Always use your most recent med list.               ciprofloxacin 500 MG tablet  Commonly known as:  CIPRO  Take 1 tablet (500 mg total) by mouth 2 (two) times daily.     rOPINIRole 0.5 MG tablet  Commonly known as:  REQUIP  Take 0.5 mg by mouth once. 3 tabs hs        Allergies:  Allergies  Allergen Reactions  . Sulfa Antibiotics     Family History: Family History  Problem Relation Age of Onset  . Kidney cancer Father     Social History:  reports that she quit smoking about 4 years ago. She does not have any smokeless tobacco history on file. She reports that she does not drink alcohol. Her drug history is not on file.   Physical Exam: BP 147/80 mmHg  Pulse 69  Ht 5\' 6"  (1.676 m)  Wt 202 lb 4.8 oz (91.763 kg)  BMI 32.67 kg/m2  Constitutional:  Alert and oriented, No acute distress. HEENT: [place] AT, moist mucus membranes.  Trachea midline, no masses. Respiratory: Normal respiratory effort, no increased work of breathing. GI: Abdomen is soft, nontender, nondistended, no abdominal masses GU: No CVA tenderness. Abdomen is soft she has some suprapubic tenderness.  Normal external genitalia and urethral meatus. Skin: No rashes, bruises or suspicious lesions.  Chronic lower extremity stasis dermatitis, edema Neurologic: Grossly intact, no focal deficits, moving all 4 extremities. Psychiatric: Normal mood and affect.  Somewhat agitated.  Laboratory Data: Lab Results  Component Value Date   WBC 4.9 01/15/2015   HGB 12.5 01/15/2015   HCT 38.2 01/15/2015   MCV 88.4 01/15/2015   PLT 173 01/15/2015    Lab Results  Component Value Date   CREATININE 0.62 01/15/2015   Urinalysis UA today shows 5-10 wbc's, greater than 30 RBCs per high-powered field, nitrate negative.   Procedure:   Cystoscopy/ Stent removal procedure   Patient identification was confirmed, informed consent was obtained, time out was performed  and patient was prepped using Betadine solution.  Lidocaine jelly was administered per urethral meatus.    Preoperative abx where received prior to procedure.    Procedure: - Flexible cystoscope introduced, without any difficulty.   - Thorough search of the bladder revealed:    normal urethral meatus  Stent seen emanating from left ureteral orifice, grasped with stent graspers, and removed in entirety.   Post-Procedure: - Patient tolerated the procedure well   Assessment & Plan:  64 year old female status post LEFT ureteroscopy, laser lithotripsy, stent placement with Dr. Edwyna Shell who returns today for stent removal.  She has been adequately treated on appropriate antibiotics (Cipro) prior to stent removal today.  Stent removal was uncomplicated and she tolerated it very well.  Recommend for follow-up in 4 weeks with renal ultrasound prior with Dr. Edwyna Shell.  1. Calculus of kidney - Urinalysis, Complete - lidocaine (XYLOCAINE) 2 % jelly 1 application; Place 1 application into the urethra once. - US Renal; Future  2. Ureteral stone with hydronephrosis S/p L URS, LL, stent    Return in about 4 weeks (around 03/06/2015) for with Dr. Edwyna Shell with renal ultrasound prior to visit.   Vanna Scotland, MD   [facility] [address], [address] [place], [place] [address] [phone]

## 2015-02-18 ENCOUNTER — Other Ambulatory Visit: Payer: 59

## 2015-02-18 DIAGNOSIS — N390 Urinary tract infection, site not specified: Secondary | ICD-10-CM

## 2015-02-18 LAB — URINALYSIS, COMPLETE
BILIRUBIN UA: NEGATIVE
GLUCOSE, UA: NEGATIVE
Ketones, UA: NEGATIVE
Nitrite, UA: NEGATIVE
PH UA: 6 (ref 5.0–7.5)
Specific Gravity, UA: 1.025 (ref 1.005–1.030)
UUROB: 0.2 mg/dL (ref 0.2–1.0)

## 2015-02-18 LAB — MICROSCOPIC EXAMINATION: WBC, UA: >30 /hpf — AB (ref 0–?)

## 2015-02-19 ENCOUNTER — Telehealth: Payer: Self-pay

## 2015-02-19 NOTE — Telephone Encounter (Signed)
-----   Message from Harle Battiest, PA-C sent at 02/18/2015  5:57 PM EDT ----- Why did this patient drop off her urine today?

## 2015-02-19 NOTE — Telephone Encounter (Signed)
Pt called c/o UTI s/s.

## 2015-02-19 NOTE — Telephone Encounter (Signed)
Pt had called after hours c/o blood in urine. Nurse called pt back. At which time pt stated there was no blood in urine, urine was just orange. Nurse reinforced with pt that was from taking AZO tablets. Pt voiced understanding. Cw,lpn

## 2015-02-20 ENCOUNTER — Other Ambulatory Visit: Payer: Self-pay | Admitting: Urology

## 2015-02-20 DIAGNOSIS — N390 Urinary tract infection, site not specified: Secondary | ICD-10-CM

## 2015-02-20 DIAGNOSIS — N189 Chronic kidney disease, unspecified: Secondary | ICD-10-CM

## 2015-02-20 DIAGNOSIS — R319 Hematuria, unspecified: Secondary | ICD-10-CM

## 2015-02-20 LAB — CULTURE, URINE COMPREHENSIVE

## 2015-02-20 MED ORDER — CIPROFLOXACIN HCL 500 MG PO TABS: 500.0000 mg | ORAL_TABLET | Freq: Two times a day (BID) | ORAL | Status: DC

## 2015-02-21 ENCOUNTER — Telehealth: Payer: Self-pay

## 2015-02-21 NOTE — Telephone Encounter (Signed)
Spoke with pt in reference to infection. Made pt aware of abt at [facility] in [place]. Pt also stated she has an appt on 03/05/15. At appt we will need a cath specimen. Pt voiced understanding. Cw,lpn

## 2015-02-21 NOTE — Telephone Encounter (Signed)
-----   Message from Harle Battiest, PA-C sent at 02/20/2015  9:39 PM EDT ----- Patient has a +UCx.  They need to start Cipro 500 mg one tablet twice daily for seven days and then we need to check a cath specimen in 3 to 5 days after they complete their antibiotics.  I have e scribed their prescription to [facility] in [place].

## 2015-02-28 ENCOUNTER — Ambulatory Visit
Admission: RE | Admit: 2015-02-28 | Discharge: 2015-02-28 | Disposition: A | Discharge status: Home / Self Care | Payer: 59 | Source: Ambulatory Visit | Attending: Urology | Admitting: Urology

## 2015-02-28 DIAGNOSIS — N200 Calculus of kidney: Secondary | ICD-10-CM | POA: Insufficient documentation

## 2015-03-05 ENCOUNTER — Ambulatory Visit
Admission: RE | Admit: 2015-03-05 | Discharge: 2015-03-05 | Disposition: A | Discharge status: Home / Self Care | Payer: 59 | Source: Ambulatory Visit | Attending: Urology | Admitting: Urology

## 2015-03-05 ENCOUNTER — Encounter: Payer: Self-pay | Admitting: Urology

## 2015-03-05 ENCOUNTER — Ambulatory Visit (INDEPENDENT_AMBULATORY_CARE_PROVIDER_SITE_OTHER): Payer: 59 | Admitting: Urology

## 2015-03-05 ENCOUNTER — Telehealth: Payer: Self-pay | Admitting: Urology

## 2015-03-05 VITALS — BP 105/70 | HR 62 | Ht 66.0 in | Wt 198.1 lb

## 2015-03-05 DIAGNOSIS — N200 Calculus of kidney: Secondary | ICD-10-CM

## 2015-03-05 DIAGNOSIS — N202 Calculus of kidney with calculus of ureter: Secondary | ICD-10-CM | POA: Diagnosis not present

## 2015-03-05 DIAGNOSIS — N390 Urinary tract infection, site not specified: Secondary | ICD-10-CM

## 2015-03-05 LAB — URINALYSIS, COMPLETE
BILIRUBIN UA: NEGATIVE
GLUCOSE, UA: NEGATIVE
KETONES UA: NEGATIVE
Nitrite, UA: NEGATIVE
PROTEIN UA: NEGATIVE
RBC UA: NEGATIVE
Specific Gravity, UA: 1.015 (ref 1.005–1.030)
UUROB: 0.2 mg/dL (ref 0.2–1.0)
pH, UA: 7 (ref 5.0–7.5)

## 2015-03-05 LAB — MICROSCOPIC EXAMINATION: BACTERIA UA: NONE SEEN

## 2015-03-05 MED ORDER — NITROFURANTOIN MONOHYD MACRO 100 MG PO CAPS: 100.0000 mg | ORAL_CAPSULE | Freq: Every day | ORAL | Status: DC

## 2015-03-05 NOTE — Progress Notes (Signed)
03/05/2015 8:57 AM   Laurie Dickson 11/27/1950 [id number]  Referring provider: Lauro RegulusMarshall W Anderson, MD [address] [place], [place] [id number]  Chief Complaint  Patient presents with  . Nephrolithiasis    4 week f/u renal ultrasound, pt [facility] 5 days ago and was put on Macrobid for UTI    HPI: Patient has known renal calculi which have been seen on ultrasound recently. She has no hydronephrosis. About 1 month ago she had a left ureteroscopy with laser lithotripsy of a obstructing left ureteral calculus the level of L3. This calculus of been present for at least a month and she was obstructed for that. Time. She was a little upset that she had infection post-instrumentation until I explained to her that he had an obstruction for over a month she had infection within the calculi within her kidney and behind her obstructing left ureteral calculus which was around about 8 or 9 mm in size. There is no evidence now of ureteral calculi or hydro-the patient does not remember passing any calculi. She has had a bypass has and has a sedentary job so her gastric bypass raspberries resulted probably in a predilection for calculi. My plan today is to do a KUB and if calculi are easily seen on a KUB I will set her up for ESWL. If the calculi are not easily seen then the patient is needs to undergo ureteroscopy (flexible) with laser lithotripsy of the midpole and lower pole calculi. These calculi are 1.2-1.4 cm in diameter. The present time she complains of no flank pain but is having recurrent urinary tract infections. I've prophylactically placed her on Macrobid for at least a month to 2 months. She does not like the side effects of systemic antibiotics.      PMH: Past Medical History  Diagnosis Date  . RLS (restless legs syndrome)   . Depression   . Hypertension   . Chronic kidney disease     stones,hydronephrosis  . Edema extremities   . Diabetes mellitus without  complication     Resolved since bariatric surgery  . Panic attacks   . Sleep apnea   . Obesity   . Depression   . Kidney stone   . Edema   . Diabetes   . Hydronephrosis     Surgical History: Past Surgical History  Procedure Laterality Date  . Abdominal hysterectomy    . Back surgery    . Bariatric surgery    . Oophorectomy    . Salpingectomy    . Ureteroscopy with holmium laser lithotripsy Left 01/20/2015    Procedure: URETEROSCOPY WITH HOLMIUM LASER LITHOTRIPSY;  Surgeon: Lorraine Laxichard D Jago Carton, MD;  Location: [facility];  Service: Urology;  Laterality: Left;  . Cystoscopy w/ retrogrades Left 01/20/2015    Procedure: CYSTOSCOPY WITH RETROGRADE PYELOGRAM;  Surgeon: Lorraine Laxichard D Rowland Ericsson, MD;  Location: [facility];  Service: Urology;  Laterality: Left;  . Cystoscopy with stent placement N/A 01/20/2015    Procedure: CYSTOSCOPY WITH STENT PLACEMENT;  Surgeon: Lorraine Laxichard D Khrystal Jeanmarie, MD;  Location: [facility];  Service: Urology;  Laterality: N/A;    Home Medications:    Medication List       This list is accurate as of: 03/05/15  8:57 AM.  Always use your most recent med list.               ciprofloxacin 500 MG tablet  Commonly known as:  CIPRO  Take 1 tablet (500 mg  total) by mouth 2 (two) times daily.     nitrofurantoin (macrocrystal-monohydrate) 100 MG capsule  Commonly known as:  MACROBID  Take by mouth.     nitrofurantoin (macrocrystal-monohydrate) 100 MG capsule  Commonly known as:  MACROBID  Take 1 capsule (100 mg total) by mouth at bedtime.     rOPINIRole 0.5 MG tablet  Commonly known as:  REQUIP  Take by mouth.        Allergies:  Allergies  Allergen Reactions  . Sulfa Antibiotics     Family History: Family History  Problem Relation Age of Onset  . Kidney cancer Father     Social History:  reports that she quit smoking about 4 years ago. She does not have any smokeless tobacco history on file. She reports that she does not drink alcohol. Her drug history is not on  file.  ROS:     Urological Symptom Review  Patient is experiencing the following symptoms: Blood in urine Urinary tract infection   Review of Systems  Gastrointestinal (upper)  : Negative for upper GI symptoms  Gastrointestinal (lower) : Negative for lower GI symptoms  Constitutional : Fatigue  Skin: Negative for skin symptoms  Eyes: Negative for eye symptoms  Ear/Nose/Throat : Negative for Ear/Nose/Throat symptoms  Hematologic/Lymphatic: Negative for Hematologic/Lymphatic symptoms  Cardiovascular : Negative for cardiovascular symptoms  Respiratory : Negative for respiratory symptoms  Endocrine: Negative for endocrine symptoms  Musculoskeletal: Negative for musculoskeletal symptoms  Neurological: Negative for neurological symptoms  Psychologic: Anxiety   Physical Exam: BP 105/70 mmHg  Pulse 62  Ht  (1.676 m)  Wt 198 lb 1.6 oz (89.858 kg)  BMI 31.99 kg/m2  Constitutional:  Alert and oriented, No acute distress. HEENT: [place] AT, moist mucus membranes.  Trachea midline, no masses. Cardiovascular: No clubbing, cyanosis, or edema. Respiratory: Normal respiratory effort, no increased work of breathing. GI: Abdomen is soft, nontender, nondistended, no abdominal masses GU: No CVA tenderness. Skin: No rashes, bruises or suspicious lesions. Lymph: No cervical or inguinal adenopathy. Neurologic: Grossly intact, no focal deficits, moving all 4 extremities. Psychiatric: Normal mood and affect.  Laboratory Data: Lab Results  Component Value Date   WBC 4.9 01/15/2015   HGB 12.5 01/15/2015   HCT 38.2 01/15/2015   MCV 88.4 01/15/2015   PLT 173 01/15/2015    Lab Results  Component Value Date   CREATININE 0.62 01/15/2015    No results found for: PSA  No results found for: TESTOSTERONE  No results found for: HGBA1C  Urinalysis    Component Value Date/Time   GLUCOSEU Negative 02/18/2015 1205   BILIRUBINUR Negative 02/18/2015 1205    NITRITE Negative 02/18/2015 1205   LEUKOCYTESUR 3+* 02/18/2015 1205    Pertinent Imaging: KUB  Assessment & Plan:  Patient has 2 left. Renal calculi that will need either shockwave lithotripsy or flexible ureteroscopy. She has gone over to the outpatient x-ray for a KUB. If the KUB is positive I will be less wall is not. This is been explained to the patient.  1. Calculus of kidney  2. UTI - Urinalysis, Complete - Abdomen 1 view (KUB); Future - Abdomen 1 view (KUB); Future - nitrofurantoin, macrocrystal-monohydrate, (MACROBID) 100 MG capsule; Take 1 capsule (100 mg total) by mouth at bedtime.  Dispense: 60 capsule; Refill: 1   No Follow-up on file.  Lorraine Lax, MD  [facility] [address], [address] [place], [place] [address] [phone]

## 2015-03-05 NOTE — Telephone Encounter (Signed)
Patient called today and stated that she would like to proceed with lithotripsy.

## 2015-03-10 NOTE — Telephone Encounter (Signed)
Patient calling again today to inquire about lithotripsy.

## 2015-03-11 NOTE — Telephone Encounter (Signed)
I spoke w/ the pt and notified her that we could schedule her for lithotripsy on this Thursday, July 14th. Th pt will come in tomorrow for nurse visit to sign lithotripsy paperwork.

## 2015-03-12 ENCOUNTER — Ambulatory Visit: Payer: 59

## 2015-03-12 ENCOUNTER — Encounter: Payer: Self-pay | Admitting: *Deleted

## 2015-03-12 DIAGNOSIS — N200 Calculus of kidney: Secondary | ICD-10-CM

## 2015-03-12 MED ORDER — CIPROFLOXACIN HCL 500 MG PO TABS
500.0000 mg | ORAL_TABLET | Freq: Once | ORAL | Status: AC
  Administered 2015-03-13: 500 mg via ORAL

## 2015-03-12 NOTE — Progress Notes (Signed)
The pt came in the office today to sign ESWL paperwork for Surgery that scheduled for tomorrow Thursday, July 14th @ 7:30am.

## 2015-03-13 ENCOUNTER — Ambulatory Visit: Payer: 59

## 2015-03-13 ENCOUNTER — Encounter
Admission: RE | Disposition: A | Discharge status: Home / Self Care | Payer: Self-pay | Source: Ambulatory Visit | Attending: Urology

## 2015-03-13 ENCOUNTER — Encounter: Payer: Self-pay | Admitting: *Deleted

## 2015-03-13 ENCOUNTER — Ambulatory Visit
Admission: RE | Admit: 2015-03-13 | Discharge: 2015-03-13 | Disposition: A | Discharge status: Home / Self Care | Payer: 59 | Source: Ambulatory Visit | Attending: Urology | Admitting: Urology

## 2015-03-13 DIAGNOSIS — Z6831 Body mass index (BMI) 31.0-31.9, adult: Secondary | ICD-10-CM | POA: Diagnosis not present

## 2015-03-13 DIAGNOSIS — N202 Calculus of kidney with calculus of ureter: Secondary | ICD-10-CM | POA: Diagnosis not present

## 2015-03-13 DIAGNOSIS — N189 Chronic kidney disease, unspecified: Secondary | ICD-10-CM | POA: Diagnosis not present

## 2015-03-13 DIAGNOSIS — Z87891 Personal history of nicotine dependence: Secondary | ICD-10-CM | POA: Diagnosis not present

## 2015-03-13 DIAGNOSIS — N200 Calculus of kidney: Secondary | ICD-10-CM | POA: Diagnosis present

## 2015-03-13 DIAGNOSIS — G2581 Restless legs syndrome: Secondary | ICD-10-CM | POA: Insufficient documentation

## 2015-03-13 DIAGNOSIS — Z79899 Other long term (current) drug therapy: Secondary | ICD-10-CM | POA: Diagnosis not present

## 2015-03-13 DIAGNOSIS — Z9884 Bariatric surgery status: Secondary | ICD-10-CM | POA: Insufficient documentation

## 2015-03-13 DIAGNOSIS — E669 Obesity, unspecified: Secondary | ICD-10-CM | POA: Diagnosis not present

## 2015-03-13 DIAGNOSIS — F329 Major depressive disorder, single episode, unspecified: Secondary | ICD-10-CM | POA: Insufficient documentation

## 2015-03-13 HISTORY — PX: EXTRACORPOREAL SHOCK WAVE LITHOTRIPSY: SHX1557

## 2015-03-13 LAB — GLUCOSE, CAPILLARY: GLUCOSE-CAPILLARY: 76 mg/dL (ref 65–99)

## 2015-03-13 SURGERY — LITHOTRIPSY, ESWL
Anesthesia: Moderate Sedation | Laterality: Right

## 2015-03-13 MED ORDER — CIPROFLOXACIN HCL 500 MG PO TABS
ORAL_TABLET | ORAL | Status: AC
  Administered 2015-03-13: 500 mg via ORAL
  Filled 2015-03-13: qty 1

## 2015-03-13 MED ORDER — DIPHENHYDRAMINE HCL 25 MG PO CAPS
25.0000 mg | ORAL_CAPSULE | ORAL | Status: AC
  Administered 2015-03-13: 25 mg via ORAL

## 2015-03-13 MED ORDER — DIAZEPAM 5 MG PO TABS
10.0000 mg | ORAL_TABLET | ORAL | Status: AC
  Administered 2015-03-13: 10 mg via ORAL

## 2015-03-13 MED ORDER — DEXTROSE-NACL 5-0.45 % IV SOLN
INTRAVENOUS | Status: DC
  Administered 2015-03-13: 07:00:00 via INTRAVENOUS

## 2015-03-13 MED ORDER — TAMSULOSIN HCL 0.4 MG PO CAPS: 0.4000 mg | ORAL_CAPSULE | Freq: Every day | ORAL | Status: DC

## 2015-03-13 MED ORDER — DIAZEPAM 5 MG PO TABS
ORAL_TABLET | ORAL | Status: AC
  Administered 2015-03-13: 10 mg via ORAL
  Filled 2015-03-13: qty 2

## 2015-03-13 MED ORDER — DIPHENHYDRAMINE HCL 25 MG PO CAPS
ORAL_CAPSULE | ORAL | Status: AC
  Administered 2015-03-13: 25 mg via ORAL
  Filled 2015-03-13: qty 1

## 2015-03-13 MED ORDER — HYDROCODONE-ACETAMINOPHEN 5-325 MG PO TABS: 1.0000 | ORAL_TABLET | Freq: Four times a day (QID) | ORAL | Status: DC | PRN

## 2015-03-13 NOTE — Discharge Instructions (Signed)
[facility]  DISCHARGE INSTRUCTIONS   1) The drugs that you were given will stay in your system until tomorrow so for the next 24 hours you should not:  A) Drive an automobile B) Make any legal decisions C) Drink any alcoholic beverage   2) You may resume regular meals tomorrow.  Today it is better to start with liquids and gradually work up to solid foods.  You may eat anything you prefer, but it is better to start with liquids, then soup and crackers, and gradually work up to solid foods.   3) Please notify your doctor immediately if you have any unusual bleeding, trouble breathing, redness and pain at the surgery site, drainage, fever, or pain not relieved by medication.    4) Additional Instructions:        Please contact your physician with any problems or Same Day Surgery at [phone], Monday through Friday 6 am to 4 pm, or [place] [facility] Main number at [phone].Lithotripsy, Care After Refer to this sheet in the next few weeks. These instructions provide you with information on caring for yourself after your procedure. Your health care provider may also give you more specific instructions. Your treatment has been planned according to current medical practices, but problems sometimes occur. Call your health care provider if you have any problems or questions after your procedure. WHAT TO EXPECT AFTER THE PROCEDURE   Your urine may have a red tinge for a few days after treatment. Blood loss is usually minimal.  You may have soreness in the back or flank area. This usually goes away after a few days. The procedure can cause blotches or bruises on the back where the pressure wave enters the skin. These marks usually cause only minimal discomfort and should disappear in a short time.  Stone fragments should begin to pass within 24 hours of treatment. However, a delayed passage is not  unusual.  You may have pain, discomfort, and feel sick to your stomach (nauseated) when the crushed fragments of stone are passed down the tube from the kidney to the bladder. Stone fragments can pass soon after the procedure and may last for up to 4-8 weeks.  A small number of patients may have severe pain when stone fragments are not able to pass, which leads to an obstruction.  If your stone is greater than 1 inch (2.5 cm) in diameter or if you have multiple stones that have a combined diameter greater than 1 inch (2.5 cm), you may require more than one treatment.  If you had a stent placed prior to your procedure, you may experience some discomfort, especially during urination. You may experience the pain or discomfort in your flank or back, or you may experience a sharp pain or discomfort at the base of your penis or in your lower abdomen. The discomfort usually lasts only a few minutes after urinating. HOME CARE INSTRUCTIONS   Rest at home until you feel your energy improving.  Only take over-the-counter or prescription medicines for pain, discomfort, or fever as directed by your health care provider. Depending on the type of lithotripsy, you may need to take antibiotics and anti-inflammatory medicines for a few days.  Drink enough water and fluids to keep your urine clear or pale yellow. This helps "flush" your kidneys. It helps pass any remaining pieces of stone and prevents stones from coming back.  Most people can resume daily activities within 1-2 days  after standard lithotripsy. It can take longer to recover from laser and percutaneous lithotripsy.  If the stones are in your urinary system, you may be asked to strain your urine at home to look for stones. Any stones that are found can be sent to a medical lab for examination.  Visit your health care provider for a follow-up appointment in a few weeks. Your doctor may remove your stent if you have one. Your health care provider will  also check to see whether stone particles still remain. SEEK MEDICAL CARE IF:   Your pain is not relieved by medicine.  You have a lasting nauseous feeling.  You feel there is too much blood in the urine.  You develop persistent problems with frequent or painful urination that does not at least partially improve after 2 days following the procedure.  You have a congested cough.  You feel lightheaded.  You develop a rash or any other signs that might suggest an allergic problem.  You develop any reaction or side effects to your medicine(s). SEEK IMMEDIATE MEDICAL CARE IF:   You experience severe back or flank pain or both.  You see nothing but blood when you urinate.  You cannot pass any urine at all.  You have a fever or shaking chills.  You develop shortness of breath, difficulty breathing, or chest pain.  You develop vomiting that will not stop after 6-8 hours.  You have a fainting episode. Document Released: 09/05/2007 Document Revised: 06/06/2013 Document Reviewed: 03/01/2013 [facility] Patient Information 2015 [place], [place]. This information is not intended to replace advice given to you by your health care provider. Make sure you discuss any questions you have with your health care provider.

## 2015-04-04 ENCOUNTER — Ambulatory Visit: Payer: 59 | Admitting: Urology

## 2015-04-07 ENCOUNTER — Encounter: Payer: Self-pay | Admitting: Urology

## 2015-04-07 ENCOUNTER — Ambulatory Visit
Admission: RE | Admit: 2015-04-07 | Discharge: 2015-04-07 | Disposition: A | Discharge status: Home / Self Care | Payer: 59 | Source: Ambulatory Visit | Attending: Urology | Admitting: Urology

## 2015-04-07 ENCOUNTER — Ambulatory Visit (INDEPENDENT_AMBULATORY_CARE_PROVIDER_SITE_OTHER): Payer: 59 | Admitting: Urology

## 2015-04-07 VITALS — BP 125/71 | HR 63 | Ht 66.0 in | Wt 206.9 lb

## 2015-04-07 DIAGNOSIS — N201 Calculus of ureter: Secondary | ICD-10-CM | POA: Diagnosis not present

## 2015-04-07 DIAGNOSIS — N200 Calculus of kidney: Secondary | ICD-10-CM | POA: Insufficient documentation

## 2015-04-07 LAB — URINALYSIS, COMPLETE
BILIRUBIN UA: NEGATIVE
Glucose, UA: NEGATIVE
Ketones, UA: NEGATIVE
Leukocytes, UA: NEGATIVE
NITRITE UA: NEGATIVE
PH UA: 6.5 (ref 5.0–7.5)
PROTEIN UA: NEGATIVE
RBC UA: NEGATIVE
SPEC GRAV UA: 1.02 (ref 1.005–1.030)
Urobilinogen, Ur: 0.2 mg/dL (ref 0.2–1.0)

## 2015-04-07 LAB — MICROSCOPIC EXAMINATION
BACTERIA UA: NONE SEEN
RBC, UA: NONE SEEN /hpf (ref 0–?)

## 2015-04-10 NOTE — Progress Notes (Signed)
04/07/2015 4:59 PM   Laurie Dickson 08/14/1951 [id number]  Referring provider: Lauro Regulus, MD [address] [place], [place] [address]  Chief Complaint  Patient presents with  . Post op    follow up with KUB    HPI: 64 year old female with a history of nephrolithiasis status post multiple procedures over the past few months for fairly significant left-sided stone burden including an obstructing ureteral stone at the level of L3.  She was originally taken to the OR on 01/20/2015 for left ureteroscopy, LL, stent at which time ureteral perforation was noted.  Her stent was subsequently removed.  Follow-up imaging demonstrated no evidence of residual hydroureteronephrosis on 02/28/2015.  KUB showed significant upper tract stone burden along with possible residual fragment at L3.   Most recently, she underwent ESWL on 03/13/2015 to treat her residual stone burden.  Both her ureteral fragment as well as some upper tract stones were treated during that session.  She returns today having passed a large number of stone fragments which she brings with her in a baggie. She tolerated ESWL quite well and would like to go serial treatments with this modality to clear her of her residual stone.  She denies any voiding symptoms today including dysuria, hematuria, fevers, or chills.   PMH: Past Medical History  Diagnosis Date  . RLS (restless legs syndrome)   . Depression   . Hypertension   . Chronic kidney disease     stones,hydronephrosis  . Edema extremities   . Diabetes mellitus without complication     Resolved since bariatric surgery  . Panic attacks   . Sleep apnea   . Obesity   . Depression   . Kidney stone   . Edema   . Diabetes   . Hydronephrosis     Surgical History: Past Surgical History  Procedure Laterality Date  . Abdominal hysterectomy    . Back surgery    . Bariatric surgery    . Oophorectomy    . Salpingectomy    . Ureteroscopy with holmium laser  lithotripsy Left 01/20/2015    Procedure: URETEROSCOPY WITH HOLMIUM LASER LITHOTRIPSY;  Surgeon: Lorraine Lax, MD;  Location: [facility];  Service: Urology;  Laterality: Left;  . Cystoscopy w/ retrogrades Left 01/20/2015    Procedure: CYSTOSCOPY WITH RETROGRADE PYELOGRAM;  Surgeon: Lorraine Lax, MD;  Location: [facility];  Service: Urology;  Laterality: Left;  . Cystoscopy with stent placement N/A 01/20/2015    Procedure: CYSTOSCOPY WITH STENT PLACEMENT;  Surgeon: Lorraine Lax, MD;  Location: [facility];  Service: Urology;  Laterality: N/A;    Home Medications:    Medication List       This list is accurate as of: 04/07/15 11:59 PM.  Always use your most recent med list.               rOPINIRole 0.5 MG tablet  Commonly known as:  REQUIP  Take 100 mg by mouth 1 day or 1 dose.        Allergies:  Allergies  Allergen Reactions  . Sulfa Antibiotics Rash    Family History: Family History  Problem Relation Age of Onset  . Kidney cancer Father     Social History:  reports that she quit smoking about 4 years ago. She does not have any smokeless tobacco history on file. She reports that she does not drink alcohol. Her drug history is not on file.  ROS: UROLOGY Frequent Urination?: No Hard to postpone  urination?: No Burning/pain with urination?: No Get up at night to urinate?: Yes Leakage of urine?: No Urine stream starts and stops?: No Trouble starting stream?: No Do you have to strain to urinate?: No Blood in urine?: No Urinary tract infection?: No Sexually transmitted disease?: No Injury to kidneys or bladder?: No Painful intercourse?: No Weak stream?: No Currently pregnant?: No Vaginal bleeding?: No Last menstrual period?: n  Gastrointestinal Nausea?: No Vomiting?: No Indigestion/heartburn?: No Diarrhea?: No Constipation?: No  Constitutional Fever: No Night sweats?: No Weight loss?: No Fatigue?: No  Skin Skin rash/lesions?: No Itching?:  No  Eyes Blurred vision?: No Double vision?: No  Ears/Nose/Throat Sore throat?: No Sinus problems?: No  Hematologic/Lymphatic Swollen glands?: No Easy bruising?: No  Cardiovascular Leg swelling?: No Chest pain?: No  Respiratory Cough?: No Shortness of breath?: No  Endocrine Excessive thirst?: No  Musculoskeletal Back pain?: No Joint pain?: No  Neurological Headaches?: No Dizziness?: No  Psychologic Depression?: No Anxiety?: No  Physical Exam: BP 125/71 mmHg  Pulse 63  Ht 5\' 6"  (1.676 m)  Wt 206 lb 14.4 oz (93.849 kg)  BMI 33.41 kg/m2  Constitutional:  Alert and oriented, No acute distress. HEENT: [place] AT, moist mucus membranes.  Trachea midline, no masses. Cardiovascular: No clubbing, cyanosis, or edema. Respiratory: Normal respiratory effort, no increased work of breathing. GI: Abdomen is soft, nontender, nondistended, no abdominal masses GU: No CVA tenderness.  Skin: No rashes, bruises or suspicious lesions. Neurologic: Grossly intact, no focal deficits, moving all 4 extremities. Psychiatric: Normal mood and affect.  Laboratory Data: Lab Results  Component Value Date   WBC 4.9 01/15/2015   HGB 12.5 01/15/2015   HCT 38.2 01/15/2015   MCV 88.4 01/15/2015   PLT 173 01/15/2015    Lab Results  Component Value Date   CREATININE 0.62 01/15/2015   Urinalysis Results for orders placed or performed in visit on 04/07/15  Microscopic Examination  Result Value Ref Range   WBC, UA 0-5 0 -  5 /hpf   RBC, UA None seen 0 -  2 /hpf   Epithelial Cells (non renal) 0-10 0 - 10 /hpf   Bacteria, UA None seen None seen/Few  Urinalysis, Complete  Result Value Ref Range   Specific Gravity, UA 1.020 1.005 - 1.030   pH, UA 6.5 5.0 - 7.5   Color, UA Yellow Yellow   Appearance Ur Clear Clear   Leukocytes, UA Negative Negative   Protein, UA Negative Negative/Trace   Glucose, UA Negative Negative   Ketones, UA Negative Negative   RBC, UA Negative Negative    Bilirubin, UA Negative Negative   Urobilinogen, Ur 0.2 0.2 - 1.0 mg/dL   Nitrite, UA Negative Negative   Microscopic Examination See below:     Pertinent Imaging: EXAM: ABDOMEN - 1 VIEW  COMPARISON: KUB of March 13, 2015  FINDINGS: On the left there is a coarse stone that projects in the region of the ureteropelvic junction. More proximally a stone which likely originally was in the upper pole now lies in the proximal renal pelvis region. An additional stone overlies the mid to lower pole but is poorly defined due to overlying stool and gas.  On the right no stones are evident. Within the pelvis there is a phlebolith to the right of midline but no definite bladder stones are observed.  There is degenerative change of the lower lumbar spine. The bowel gas pattern is unremarkable.  IMPRESSION: There are persistent kidney stones on the left. There has been  some change in position of the upper pole stone such that it now lies more medially. There is a persistent stone in the ureteropelvic junction.   Electronically Signed  By: David Swaziland M.D.  On: 04/07/2015 15:57  Assessment & Plan:  64 year old female with large left stone burden status post left ureteroscopy for obstructing ureteral stone followed by a left ESWL. She has passed a good amount of fragment and had good success with ESWL. KUB today shows interval proximal fragmentation of her left sided stones.    There is a subtle shadow at L3 but I do not suspect that this represents an intraluminal ureteral stone given the absence of hydronephrosis, pain, and interval passage of multiple upper tract stone fragments.  1. Left nephrolithiasis Patient would like to proceed with staged ESWL to continue to treat her left-sided stone burden which she tolerated much better than ureteroscopy.  As previously, the risks of the procedure were reviewed in detail. Plan for procedure in 2-3 weeks. - Urinalysis,  Complete   Return for book lithotripsy.  Vanna Scotland, MD  [facility] [address] [place], [place] [address] [phone]

## 2015-04-18 ENCOUNTER — Encounter
Admission: RE | Admit: 2015-04-18 | Discharge: 2015-04-18 | Disposition: A | Discharge status: Home / Self Care | Payer: 59 | Source: Ambulatory Visit | Attending: Urology | Admitting: Urology

## 2015-04-23 ENCOUNTER — Encounter: Payer: Self-pay | Admitting: *Deleted

## 2015-04-24 ENCOUNTER — Encounter: Payer: Self-pay | Admitting: *Deleted

## 2015-04-24 ENCOUNTER — Ambulatory Visit
Admission: RE | Admit: 2015-04-24 | Discharge: 2015-04-24 | Disposition: A | Discharge status: Home / Self Care | Payer: 59 | Source: Ambulatory Visit | Attending: Urology | Admitting: Urology

## 2015-04-24 ENCOUNTER — Ambulatory Visit: Payer: 59

## 2015-04-24 ENCOUNTER — Encounter
Admission: RE | Disposition: A | Discharge status: Home / Self Care | Payer: Self-pay | Source: Ambulatory Visit | Attending: Urology

## 2015-04-24 DIAGNOSIS — Z87442 Personal history of urinary calculi: Secondary | ICD-10-CM | POA: Diagnosis not present

## 2015-04-24 DIAGNOSIS — G2581 Restless legs syndrome: Secondary | ICD-10-CM | POA: Insufficient documentation

## 2015-04-24 DIAGNOSIS — I129 Hypertensive chronic kidney disease with stage 1 through stage 4 chronic kidney disease, or unspecified chronic kidney disease: Secondary | ICD-10-CM | POA: Insufficient documentation

## 2015-04-24 DIAGNOSIS — Z9884 Bariatric surgery status: Secondary | ICD-10-CM | POA: Diagnosis not present

## 2015-04-24 DIAGNOSIS — N189 Chronic kidney disease, unspecified: Secondary | ICD-10-CM | POA: Insufficient documentation

## 2015-04-24 DIAGNOSIS — N132 Hydronephrosis with renal and ureteral calculous obstruction: Secondary | ICD-10-CM | POA: Insufficient documentation

## 2015-04-24 DIAGNOSIS — G4730 Sleep apnea, unspecified: Secondary | ICD-10-CM | POA: Insufficient documentation

## 2015-04-24 DIAGNOSIS — Z882 Allergy status to sulfonamides status: Secondary | ICD-10-CM | POA: Insufficient documentation

## 2015-04-24 DIAGNOSIS — Z87891 Personal history of nicotine dependence: Secondary | ICD-10-CM | POA: Insufficient documentation

## 2015-04-24 DIAGNOSIS — E119 Type 2 diabetes mellitus without complications: Secondary | ICD-10-CM | POA: Insufficient documentation

## 2015-04-24 DIAGNOSIS — N200 Calculus of kidney: Secondary | ICD-10-CM

## 2015-04-24 DIAGNOSIS — Z6833 Body mass index (BMI) 33.0-33.9, adult: Secondary | ICD-10-CM | POA: Insufficient documentation

## 2015-04-24 DIAGNOSIS — F329 Major depressive disorder, single episode, unspecified: Secondary | ICD-10-CM | POA: Insufficient documentation

## 2015-04-24 DIAGNOSIS — N201 Calculus of ureter: Secondary | ICD-10-CM | POA: Diagnosis present

## 2015-04-24 HISTORY — PX: EXTRACORPOREAL SHOCK WAVE LITHOTRIPSY: SHX1557

## 2015-04-24 LAB — GLUCOSE, CAPILLARY: GLUCOSE-CAPILLARY: 65 mg/dL (ref 65–99)

## 2015-04-24 SURGERY — LITHOTRIPSY, ESWL
Anesthesia: Moderate Sedation | Laterality: Left

## 2015-04-24 MED ORDER — DIPHENHYDRAMINE HCL 25 MG PO CAPS
25.0000 mg | ORAL_CAPSULE | ORAL | Status: AC
  Administered 2015-04-24: 25 mg via ORAL

## 2015-04-24 MED ORDER — DEXTROSE-NACL 5-0.45 % IV SOLN: INTRAVENOUS | Status: DC

## 2015-04-24 MED ORDER — DIAZEPAM 5 MG PO TABS
ORAL_TABLET | ORAL | Status: AC
  Administered 2015-04-24: 10 mg via ORAL
  Filled 2015-04-24: qty 2

## 2015-04-24 MED ORDER — DIAZEPAM 5 MG PO TABS
10.0000 mg | ORAL_TABLET | ORAL | Status: AC
  Administered 2015-04-24: 10 mg via ORAL

## 2015-04-24 MED ORDER — CIPROFLOXACIN HCL 500 MG PO TABS
500.0000 mg | ORAL_TABLET | ORAL | Status: AC
  Administered 2015-04-24: 500 mg via ORAL

## 2015-04-24 MED ORDER — CIPROFLOXACIN HCL 500 MG PO TABS
ORAL_TABLET | ORAL | Status: AC
  Administered 2015-04-24: 500 mg via ORAL
  Filled 2015-04-24: qty 1

## 2015-04-24 MED ORDER — DIPHENHYDRAMINE HCL 25 MG PO CAPS
ORAL_CAPSULE | ORAL | Status: AC
  Administered 2015-04-24: 25 mg via ORAL
  Filled 2015-04-24: qty 1

## 2015-04-24 NOTE — Discharge Instructions (Signed)
AMBULATORY SURGERY  DISCHARGE INSTRUCTIONS   1) The drugs that you were given will stay in your system until tomorrow so for the next 24 hours you should not:  A) Drive an automobile B) Make any legal decisions C) Drink any alcoholic beverage   2) You may resume regular meals tomorrow.  Today it is better to start with liquids and gradually work up to solid foods.  You may eat anything you prefer, but it is better to start with liquids, then soup and crackers, and gradually work up to solid foods.   3) Please notify your doctor immediately if you have any unusual bleeding, trouble breathing, redness and pain at the surgery site, drainage, fever, or pain not relieved by medication.    4) Additional Instructions:    Drink 2 qts water daily.    Please contact your physician with any problems or Same Day Surgery at [phone], Monday through Friday 6 am to 4 pm, or [place] at [facility] number at [phone].

## 2015-05-07 ENCOUNTER — Other Ambulatory Visit: Payer: Self-pay | Admitting: Urology

## 2015-05-09 ENCOUNTER — Encounter: Payer: Self-pay | Admitting: Urology

## 2015-05-14 ENCOUNTER — Ambulatory Visit: Payer: 59 | Admitting: Urology

## 2015-05-15 ENCOUNTER — Ambulatory Visit (INDEPENDENT_AMBULATORY_CARE_PROVIDER_SITE_OTHER): Payer: 59 | Admitting: Urology

## 2015-05-15 ENCOUNTER — Ambulatory Visit: Payer: Self-pay

## 2015-05-15 ENCOUNTER — Other Ambulatory Visit: Payer: Self-pay | Admitting: Urology

## 2015-05-15 ENCOUNTER — Other Ambulatory Visit: Payer: Self-pay | Admitting: Obstetrics and Gynecology

## 2015-05-15 ENCOUNTER — Ambulatory Visit
Admission: RE | Admit: 2015-05-15 | Discharge: 2015-05-15 | Disposition: A | Discharge status: Home / Self Care | Payer: 59 | Source: Ambulatory Visit | Attending: Urology | Admitting: Urology

## 2015-05-15 VITALS — BP 146/82 | HR 54 | Ht 66.0 in | Wt 206.2 lb

## 2015-05-15 DIAGNOSIS — N200 Calculus of kidney: Secondary | ICD-10-CM | POA: Diagnosis not present

## 2015-05-15 NOTE — Progress Notes (Signed)
05/15/2015 3:59 PM   Laurie Dickson November 15, 1950 [id number]  Referring provider: Lauro Regulus, MD [address] [place], [place] [address]  Chief Complaint  Patient presents with  . Nephrolithiasis    3wk post litho w/KUB    HPI: At shockwave therapy last week. Here for KUB review. Plan KUB in another month.   PMH: Past Medical History  Diagnosis Date  . RLS (restless legs syndrome)   . Depression   . Hypertension   . Chronic kidney disease     stones,hydronephrosis  . Edema extremities   . Diabetes mellitus without complication     Resolved since bariatric surgery  . Panic attacks   . Sleep apnea   . Obesity   . Depression   . Kidney stone   . Edema   . Diabetes   . Hydronephrosis     Surgical History: Past Surgical History  Procedure Laterality Date  . Abdominal hysterectomy    . Back surgery    . Bariatric surgery    . Oophorectomy    . Salpingectomy    . Ureteroscopy with holmium laser lithotripsy Left 01/20/2015    Procedure: URETEROSCOPY WITH HOLMIUM LASER LITHOTRIPSY;  Surgeon: Lorraine Lax, MD;  Location: [facility];  Service: Urology;  Laterality: Left;  . Cystoscopy w/ retrogrades Left 01/20/2015    Procedure: CYSTOSCOPY WITH RETROGRADE PYELOGRAM;  Surgeon: Lorraine Lax, MD;  Location: [facility];  Service: Urology;  Laterality: Left;  . Cystoscopy with stent placement N/A 01/20/2015    Procedure: CYSTOSCOPY WITH STENT PLACEMENT;  Surgeon: Lorraine Lax, MD;  Location: [facility];  Service: Urology;  Laterality: N/A;  . Extracorporeal shock wave lithotripsy Left 04/24/2015    Procedure: EXTRACORPOREAL SHOCK WAVE LITHOTRIPSY (ESWL);  Surgeon: Lorraine Lax, MD;  Location: [facility];  Service: Urology;  Laterality: Left;  . Extracorporeal shock wave lithotripsy Right 03/13/2015    Procedure: EXTRACORPOREAL SHOCK WAVE LITHOTRIPSY (ESWL);  Surgeon: Vanna Scotland, MD;  Location: [facility];  Service: Urology;  Laterality: Right;    Home Medications:     Medication List       This list is accurate as of: 05/15/15  3:59 PM.  Always use your most recent med list.               rOPINIRole 0.5 MG tablet  Commonly known as:  REQUIP  Take 100 mg by mouth 1 day or 1 dose.        Allergies:  Allergies  Allergen Reactions  . Sulfa Antibiotics Rash    Family History: Family History  Problem Relation Age of Onset  . Kidney cancer Father     Social History:  reports that she quit smoking about 4 years ago. She does not have any smokeless tobacco history on file. She reports that she does not drink alcohol. Her drug history is not on file.  ROS: UROLOGY Frequent Urination?: No Hard to postpone urination?: No Burning/pain with urination?: No Get up at night to urinate?: No Leakage of urine?: No Urine stream starts and stops?: No Trouble starting stream?: No Do you have to strain to urinate?: No Blood in urine?: No Urinary tract infection?: No Sexually transmitted disease?: No Injury to kidneys or bladder?: No Painful intercourse?: No Weak stream?: No Currently pregnant?: No Vaginal bleeding?: No Last menstrual period?: n  Gastrointestinal Nausea?: No Vomiting?: No Indigestion/heartburn?: No Diarrhea?: No Constipation?: No  Constitutional Fever: No Night sweats?: No Weight loss?: No Fatigue?: No  Skin Skin rash/lesions?: No Itching?: No  Eyes Blurred vision?: No Double vision?: No  Ears/Nose/Throat Sore throat?: No Sinus problems?: No  Hematologic/Lymphatic Swollen glands?: No Easy bruising?: No  Cardiovascular Leg swelling?: No Chest pain?: No  Respiratory Cough?: No Shortness of breath?: No  Endocrine Excessive thirst?: No  Musculoskeletal Back pain?: No Joint pain?: No  Neurological Headaches?: No Dizziness?: No  Psychologic Depression?: No Anxiety?: No  Physical Exam: BP 146/82 mmHg  Pulse 54  Ht  (1.676 m)  Wt 206 lb 3.2 oz (93.532 kg)  BMI 33.30 kg/m2    Constitutional:  Alert and oriented, No acute distress. HEENT:  AT, moist mucus membranes.  Trachea midline, no masses. Cardiovascular: No clubbing, cyanosis, or edema. Respiratory: Normal respiratory effort, no increased work of breathing. GI: Abdomen is soft, nontender, nondistended, no abdominal masses GU: No CVA tenderness.  Skin: No rashes, bruises or suspicious lesions. Lymph: No cervical or inguinal adenopathy. Neurologic: Grossly intact, no focal deficits, moving all 4 extremities. Psychiatric: Normal mood and affect.  Laboratory Data: Lab Results  Component Value Date   WBC 4.9 01/15/2015   HGB 12.5 01/15/2015   HCT 38.2 01/15/2015   MCV 88.4 01/15/2015   PLT 173 01/15/2015    Lab Results  Component Value Date   CREATININE 0.62 01/15/2015    No results found for: PSA  No results found for: TESTOSTERONE  No results found for: HGBA1C  Urinalysis    Component Value Date/Time   GLUCOSEU Negative 04/07/2015 1553   BILIRUBINUR Negative 04/07/2015 1553   NITRITE Negative 04/07/2015 1553   LEUKOCYTESUR Negative 04/07/2015 1553    Pertinent Imaging: KUB see below  Assessment & Plan:  Left renal calculus with fragments of a left ureteral calculus present adjacent to L3 transverse process no pain so she had with this will redo the KUB and one month  - Urinalysis, Complete - Abdomen 1 view (KUB); Future   No Follow-up on file.  Lorraine Lax, MD  [facility] [address] [place], [place] [address] [phone]

## 2015-05-16 LAB — URINALYSIS, COMPLETE
Bilirubin, UA: NEGATIVE
Glucose, UA: NEGATIVE
Ketones, UA: NEGATIVE
Leukocytes, UA: NEGATIVE
Nitrite, UA: NEGATIVE
PH UA: 5.5 (ref 5.0–7.5)
PROTEIN UA: NEGATIVE
Specific Gravity, UA: 1.01 (ref 1.005–1.030)
UUROB: 0.2 mg/dL (ref 0.2–1.0)

## 2015-05-16 LAB — MICROSCOPIC EXAMINATION: RBC, UA: NONE SEEN /hpf (ref 0–?)

## 2015-05-19 ENCOUNTER — Telehealth: Payer: Self-pay

## 2015-05-19 NOTE — Telephone Encounter (Signed)
Pt has an appt on file for 62mo f/u with KUB.

## 2015-05-19 NOTE — Telephone Encounter (Signed)
-----   Message from Harle Battiest, PA-C sent at 05/18/2015  6:15 PM EDT ----- According to Dr. Scheryl Darter note from the 15th, he has reviewed the KUB. He would like the patient to return in 1 month for KUB and office visit.

## 2015-06-17 ENCOUNTER — Ambulatory Visit
Admission: RE | Admit: 2015-06-17 | Discharge: 2015-06-17 | Disposition: A | Discharge status: Home / Self Care | Payer: 59 | Source: Ambulatory Visit | Attending: Urology | Admitting: Urology

## 2015-06-17 ENCOUNTER — Encounter: Payer: Self-pay | Admitting: Urology

## 2015-06-17 ENCOUNTER — Ambulatory Visit: Payer: 59 | Admitting: Urology

## 2015-06-17 VITALS — BP 116/72 | HR 59 | Ht 66.0 in | Wt 211.3 lb

## 2015-06-17 DIAGNOSIS — N200 Calculus of kidney: Secondary | ICD-10-CM

## 2015-06-17 DIAGNOSIS — N202 Calculus of kidney with calculus of ureter: Secondary | ICD-10-CM | POA: Diagnosis not present

## 2015-06-17 LAB — URINALYSIS, COMPLETE
Bilirubin, UA: NEGATIVE
Glucose, UA: NEGATIVE
Ketones, UA: NEGATIVE
LEUKOCYTES UA: NEGATIVE
NITRITE UA: NEGATIVE
PH UA: 6 (ref 5.0–7.5)
Protein, UA: NEGATIVE
RBC, UA: NEGATIVE
Specific Gravity, UA: 1.02 (ref 1.005–1.030)
Urobilinogen, Ur: 1 mg/dL (ref 0.2–1.0)

## 2015-06-17 LAB — MICROSCOPIC EXAMINATION
Epithelial Cells (non renal): >10 /hpf — ABNORMAL HIGH (ref 0–10)
RBC MICROSCOPIC, UA: NONE SEEN /HPF (ref 0–?)

## 2015-06-17 NOTE — Progress Notes (Unsigned)
06/17/2015 3:53 PM   Laurie Dickson [birth year] [id number]  Referring provider: Lauro Regulus, MD [address] [facility] [place], [place] [address]  Chief Complaint  Patient presents with  . Nephrolithiasis    UVO:ZDGU calculous present on left side upper pole calyx.  It it 10mm  Plan to do eswl before end of year     PMH: Past Medical History  Diagnosis Date  . RLS (restless legs syndrome)   . Depression   . Hypertension   . Chronic kidney disease     stones,hydronephrosis  . Edema extremities   . Diabetes mellitus without complication (HCC)     Resolved since bariatric surgery  . Panic attacks   . Sleep apnea   . Obesity   . Depression   . Kidney stone   . Edema   . Diabetes (HCC)   . Hydronephrosis   . Calculus of kidney 01/25/2013  . Hematuria, microscopic 01/12/2013    Surgical History: Past Surgical History  Procedure Laterality Date  . Abdominal hysterectomy    . Back surgery    . Bariatric surgery    . Oophorectomy    . Salpingectomy    . Ureteroscopy with holmium laser lithotripsy Left 01/20/2015    Procedure: URETEROSCOPY WITH HOLMIUM LASER LITHOTRIPSY;  Surgeon: Lorraine Lax, MD;  Location: [facility];  Service: Urology;  Laterality: Left;  . Cystoscopy w/ retrogrades Left 01/20/2015    Procedure: CYSTOSCOPY WITH RETROGRADE PYELOGRAM;  Surgeon: Lorraine Lax, MD;  Location: [facility];  Service: Urology;  Laterality: Left;  . Cystoscopy with stent placement N/A 01/20/2015    Procedure: CYSTOSCOPY WITH STENT PLACEMENT;  Surgeon: Lorraine Lax, MD;  Location: [facility];  Service: Urology;  Laterality: N/A;  . Extracorporeal shock wave lithotripsy Left 04/24/2015    Procedure: EXTRACORPOREAL SHOCK WAVE LITHOTRIPSY (ESWL);  Surgeon: Lorraine Lax, MD;  Location: [facility];  Service: Urology;  Laterality: Left;  . Extracorporeal shock wave lithotripsy Right 03/13/2015    Procedure: EXTRACORPOREAL SHOCK WAVE LITHOTRIPSY (ESWL);   Surgeon: Vanna Scotland, MD;  Location: [facility];  Service: Urology;  Laterality: Right;    Home Medications:    Medication List       This list is accurate as of: 06/17/15  3:53 PM.  Always use your most recent med list.               rOPINIRole 0.5 MG tablet  Commonly known as:  REQUIP  Take 100 mg by mouth 1 day or 1 dose.        Allergies:  Allergies  Allergen Reactions  . Sulfa Antibiotics Rash    Family History: Family History  Problem Relation Age of Onset  . Kidney cancer Father     Social History:  reports that she quit smoking about 4 years ago. She does not have any smokeless tobacco history on file. She reports that she does not drink alcohol. Her drug history is not on file.  ROS: UROLOGY Frequent Urination?: No Hard to postpone urination?: No Burning/pain with urination?: No Get up at night to urinate?: No Leakage of urine?: No Urine stream starts and stops?: No Trouble starting stream?: No Do you have to strain to urinate?: No Blood in urine?: No Urinary tract infection?: No Sexually transmitted disease?: No Injury to kidneys or bladder?: No Painful intercourse?: No Weak stream?: No Currently pregnant?: No Vaginal bleeding?: No Last menstrual period?: 41yrs  Gastrointestinal Nausea?: No Vomiting?:  No Indigestion/heartburn?: No Diarrhea?: No Constipation?: No  Constitutional Fever: No Night sweats?: No Weight loss?: No Fatigue?: No  Skin Skin rash/lesions?: No Itching?: No  Eyes Blurred vision?: No Double vision?: No  Ears/Nose/Throat Sore throat?: No Sinus problems?: No  Hematologic/Lymphatic Swollen glands?: No Easy bruising?: No  Cardiovascular Leg swelling?: No Chest pain?: No  Respiratory Cough?: No Shortness of breath?: No  Endocrine Excessive thirst?: No  Musculoskeletal Back pain?: No Joint pain?: No  Neurological Headaches?: No Dizziness?: No  Psychologic Depression?: No Anxiety?:  No  Physical Exam: BP 116/72 mmHg  Pulse 59  Ht 5\' 6"  (1.676 m)  Wt 211 lb 4.8 oz (95.845 kg)  BMI 34.12 kg/m2  Constitutional:  Alert and oriented, No acute distress. HEENT: [place] AT, moist mucus membranes.  Trachea midline, no masses. Cardiovascular: No clubbing, cyanosis, or edema. Respiratory: Normal respiratory effort, no increased work of breathing. GI: Abdomen is soft, nontender, nondistended, no abdominal masses GU: No CVA tenderness. Skin: No rashes, bruises or suspicious lesions. Lymph: No cervical or inguinal adenopathy. Neurologic: Grossly intact, no focal deficits, moving all 4 extremities. Psychiatric: Normal mood and affect.  Laboratory Data: Lab Results  Component Value Date   WBC 4.9 01/15/2015   HGB 12.5 01/15/2015   HCT 38.2 01/15/2015   MCV 88.4 01/15/2015   PLT 173 01/15/2015    Lab Results  Component Value Date   CREATININE 0.62 01/15/2015    No results found for: PSA  No results found for: TESTOSTERONE  No results found for: HGBA1C  Urinalysis    Component Value Date/Time   GLUCOSEU Negative 05/15/2015 1546   BILIRUBINUR Negative 05/15/2015 1546   NITRITE Negative 05/15/2015 1546   LEUKOCYTESUR Negative 05/15/2015 1546    Pertinent Imaging:KUB   Assessment & Plan:  Persistent left renal calculous  Will schedule for ESWL in future  Has no more calculi in treated areas  1. Calculus of kidney  - Urinalysis, Complete   Return in about 4 weeks (around 07/15/2015) for post eswl.  Lorraine Laxichard D Hart, MD  [facility] [address], [address] [place], [place] [id number] [phone]

## 2015-07-03 ENCOUNTER — Ambulatory Visit
Admission: RE | Admit: 2015-07-03 | Discharge: 2015-07-03 | Disposition: A | Discharge status: Home / Self Care | Payer: 59 | Source: Ambulatory Visit | Attending: Urology | Admitting: Urology

## 2015-07-03 ENCOUNTER — Encounter: Payer: Self-pay | Admitting: *Deleted

## 2015-07-03 ENCOUNTER — Encounter
Admission: RE | Disposition: A | Discharge status: Home / Self Care | Payer: Self-pay | Source: Ambulatory Visit | Attending: Urology

## 2015-07-03 ENCOUNTER — Ambulatory Visit: Payer: 59

## 2015-07-03 DIAGNOSIS — G2581 Restless legs syndrome: Secondary | ICD-10-CM | POA: Diagnosis not present

## 2015-07-03 DIAGNOSIS — E669 Obesity, unspecified: Secondary | ICD-10-CM | POA: Diagnosis not present

## 2015-07-03 DIAGNOSIS — Z6833 Body mass index (BMI) 33.0-33.9, adult: Secondary | ICD-10-CM | POA: Diagnosis not present

## 2015-07-03 DIAGNOSIS — Z79899 Other long term (current) drug therapy: Secondary | ICD-10-CM | POA: Insufficient documentation

## 2015-07-03 DIAGNOSIS — I10 Essential (primary) hypertension: Secondary | ICD-10-CM | POA: Diagnosis not present

## 2015-07-03 DIAGNOSIS — G4730 Sleep apnea, unspecified: Secondary | ICD-10-CM | POA: Diagnosis not present

## 2015-07-03 DIAGNOSIS — E119 Type 2 diabetes mellitus without complications: Secondary | ICD-10-CM | POA: Insufficient documentation

## 2015-07-03 DIAGNOSIS — Z882 Allergy status to sulfonamides status: Secondary | ICD-10-CM | POA: Insufficient documentation

## 2015-07-03 DIAGNOSIS — N200 Calculus of kidney: Secondary | ICD-10-CM

## 2015-07-03 HISTORY — PX: EXTRACORPOREAL SHOCK WAVE LITHOTRIPSY: SHX1557

## 2015-07-03 SURGERY — LITHOTRIPSY, ESWL
Anesthesia: Moderate Sedation | Laterality: Left

## 2015-07-03 MED ORDER — DIPHENHYDRAMINE HCL 25 MG PO CAPS
25.0000 mg | ORAL_CAPSULE | ORAL | Status: AC
  Administered 2015-07-03: 25 mg via ORAL

## 2015-07-03 MED ORDER — DIAZEPAM 5 MG PO TABS
ORAL_TABLET | ORAL | Status: AC
  Administered 2015-07-03: 10 mg via ORAL
  Filled 2015-07-03: qty 2

## 2015-07-03 MED ORDER — CIPROFLOXACIN HCL 500 MG PO TABS
500.0000 mg | ORAL_TABLET | Freq: Once | ORAL | Status: AC
  Administered 2015-07-03: 500 mg via ORAL

## 2015-07-03 MED ORDER — CIPROFLOXACIN HCL 500 MG PO TABS
ORAL_TABLET | ORAL | Status: AC
  Administered 2015-07-03: 500 mg via ORAL
  Filled 2015-07-03: qty 1

## 2015-07-03 MED ORDER — DIPHENHYDRAMINE HCL 25 MG PO CAPS
ORAL_CAPSULE | ORAL | Status: AC
  Administered 2015-07-03: 25 mg via ORAL
  Filled 2015-07-03: qty 1

## 2015-07-03 MED ORDER — DIAZEPAM 5 MG PO TABS
10.0000 mg | ORAL_TABLET | ORAL | Status: AC
  Administered 2015-07-03: 10 mg via ORAL

## 2015-07-03 MED ORDER — ONDANSETRON HCL 4 MG/2ML IJ SOLN
INTRAMUSCULAR | Status: AC
  Administered 2015-07-03: 4 mg via INTRAVENOUS
  Filled 2015-07-03: qty 2

## 2015-07-03 MED ORDER — ONDANSETRON HCL 4 MG/2ML IJ SOLN
4.0000 mg | Freq: Once | INTRAMUSCULAR | Status: AC
  Administered 2015-07-03: 4 mg via INTRAVENOUS

## 2015-07-03 MED ORDER — DEXTROSE-NACL 5-0.45 % IV SOLN
INTRAVENOUS | Status: DC
  Administered 2015-07-03: 12:00:00 via INTRAVENOUS

## 2015-07-03 NOTE — Progress Notes (Signed)
Pt voided   Urine bloody  No stones noted

## 2015-07-07 ENCOUNTER — Telehealth: Payer: Self-pay | Admitting: Urology

## 2015-07-07 NOTE — Telephone Encounter (Signed)
LMOM

## 2015-07-07 NOTE — Telephone Encounter (Signed)
Patient called the after hours nurse line on 07/06/2015 at 8:41PM: Patient states that she had lithotripsy on Thursday and she is having intermittent pain on lower left abdomen.  Pain was severe until she took one of her pain pills at 1pm.  Pain is every few seconds and now level 3 at highest.  Pain was a 6 before taking medication.  No fever. No blood in urine, no pain during urination.  Will you please call and check on this patient?

## 2015-07-08 NOTE — Telephone Encounter (Signed)
Pt returned the call stating she felt better.

## 2015-07-09 ENCOUNTER — Other Ambulatory Visit: Payer: 59

## 2015-07-09 DIAGNOSIS — R339 Retention of urine, unspecified: Secondary | ICD-10-CM

## 2015-07-09 LAB — URINALYSIS, COMPLETE
Bilirubin, UA: NEGATIVE
Glucose, UA: NEGATIVE
Ketones, UA: NEGATIVE
Nitrite, UA: NEGATIVE
PH UA: 5.5 (ref 5.0–7.5)
Specific Gravity, UA: 1.015 (ref 1.005–1.030)
Urobilinogen, Ur: 0.2 mg/dL (ref 0.2–1.0)

## 2015-07-09 LAB — MICROSCOPIC EXAMINATION

## 2015-07-11 LAB — CULTURE, URINE COMPREHENSIVE

## 2015-07-18 ENCOUNTER — Ambulatory Visit (INDEPENDENT_AMBULATORY_CARE_PROVIDER_SITE_OTHER): Payer: 59 | Admitting: Urology

## 2015-07-18 ENCOUNTER — Ambulatory Visit
Admission: RE | Admit: 2015-07-18 | Discharge: 2015-07-18 | Disposition: A | Discharge status: Home / Self Care | Payer: 59 | Source: Ambulatory Visit | Attending: Urology | Admitting: Urology

## 2015-07-18 ENCOUNTER — Ambulatory Visit: Payer: 59

## 2015-07-18 ENCOUNTER — Encounter: Payer: Self-pay | Admitting: Urology

## 2015-07-18 VITALS — BP 131/84 | HR 64 | Ht 66.0 in | Wt 210.9 lb

## 2015-07-18 DIAGNOSIS — N200 Calculus of kidney: Secondary | ICD-10-CM | POA: Diagnosis present

## 2015-07-18 DIAGNOSIS — N202 Calculus of kidney with calculus of ureter: Secondary | ICD-10-CM | POA: Diagnosis not present

## 2015-07-18 LAB — URINALYSIS, COMPLETE
Bilirubin, UA: NEGATIVE
Glucose, UA: NEGATIVE
KETONES UA: NEGATIVE
NITRITE UA: NEGATIVE
PH UA: 6 (ref 5.0–7.5)
Protein, UA: NEGATIVE
SPEC GRAV UA: 1.015 (ref 1.005–1.030)
Urobilinogen, Ur: 0.2 mg/dL (ref 0.2–1.0)

## 2015-07-18 LAB — MICROSCOPIC EXAMINATION: RBC MICROSCOPIC, UA: NONE SEEN /HPF (ref 0–?)

## 2015-07-18 NOTE — Progress Notes (Signed)
07/18/2015 2:26 PM   Laurie Dickson 07/02/1951 [id number]  Referring provider: Lauro RegulusMarshall W Anderson, MD [address] [facility] [place] [place], [place] [id number]  Chief Complaint  Patient presents with  . Routine Post Op    lithotripsy     HPI: 64 year old female with a history of nephrolithiasis status post multiple procedures over the past few months for fairly significant left-sided stone burden including an obstructing ureteral stone at the level of L3. She was originally taken to the OR on 01/20/2015 for left ureteroscopy, LL, stent at which time ureteral perforation was noted. Her stent was subsequently removed. Follow-up imaging demonstrated no evidence of residual hydroureteronephrosis on 02/28/2015. KUB showed significant upper tract stone burden along with possible residual fragment at L3.   She underwent ESWL on 03/13/2015 to treat her residual stone burden. Both her ureteral fragment as well as some upper tract stones were treated during that session.  Interval History: She returns today after undergoing lithotripsy for a left renal stone on 06/17/2015.  KUB from today shows resolved left renal stone. There appears to be stone in her proximal left ureter which is also present on her last KUB at the level of L3. It is difficult to tell if this is one stone or stone fragments.  Postoperatively, she has done well. She denies any pain. She has had no nausea, vomiting, fevers, or chills.    PMH: Past Medical History  Diagnosis Date  . RLS (restless legs syndrome)   . Depression   . Hypertension   . Chronic kidney disease     stones,hydronephrosis  . Edema extremities   . Diabetes mellitus without complication (HCC)     Resolved since bariatric surgery  . Panic attacks   . Sleep apnea   . Obesity   . Depression   . Kidney stone   . Edema   . Diabetes (HCC)   . Hydronephrosis   . Calculus of kidney 01/25/2013  . Hematuria, microscopic 01/12/2013     Surgical History: Past Surgical History  Procedure Laterality Date  . Abdominal hysterectomy    . Back surgery    . Bariatric surgery    . Oophorectomy    . Salpingectomy    . Ureteroscopy with holmium laser lithotripsy Left 01/20/2015    Procedure: URETEROSCOPY WITH HOLMIUM LASER LITHOTRIPSY;  Surgeon: Lorraine Laxichard D Hart, MD;  Location: [facility];  Service: Urology;  Laterality: Left;  . Cystoscopy w/ retrogrades Left 01/20/2015    Procedure: CYSTOSCOPY WITH RETROGRADE PYELOGRAM;  Surgeon: Lorraine Laxichard D Hart, MD;  Location: [facility];  Service: Urology;  Laterality: Left;  . Cystoscopy with stent placement N/A 01/20/2015    Procedure: CYSTOSCOPY WITH STENT PLACEMENT;  Surgeon: Lorraine Laxichard D Hart, MD;  Location: [facility];  Service: Urology;  Laterality: N/A;  . Extracorporeal shock wave lithotripsy Left 04/24/2015    Procedure: EXTRACORPOREAL SHOCK WAVE LITHOTRIPSY (ESWL);  Surgeon: Lorraine Laxichard D Hart, MD;  Location: [facility];  Service: Urology;  Laterality: Left;  . Extracorporeal shock wave lithotripsy Right 03/13/2015    Procedure: EXTRACORPOREAL SHOCK WAVE LITHOTRIPSY (ESWL);  Surgeon: Vanna ScotlandAshley Brandon, MD;  Location: [facility];  Service: Urology;  Laterality: Right;  . Extracorporeal shock wave lithotripsy Left 07/03/2015    Procedure: EXTRACORPOREAL SHOCK WAVE LITHOTRIPSY (ESWL);  Surgeon: Lorraine Laxichard D Hart, MD;  Location: [facility];  Service: Urology;  Laterality: Left;    Home Medications:    Medication List       This list is accurate  as of: 07/18/15  2:26 PM.  Always use your most recent med list.               oxyCODONE-acetaminophen 5-325 MG tablet  Commonly known as:  PERCOCET/ROXICET  TK 1 T PO Q 6 H PRN P     rOPINIRole 0.5 MG tablet  Commonly known as:  REQUIP  Take 100 mg by mouth 1 day or 1 dose.        Allergies:  Allergies  Allergen Reactions  . Sulfa Antibiotics Rash    Family History: Family History  Problem Relation Age of Onset  . Kidney cancer Father     Social  History:  reports that she quit smoking about 4 years ago. She does not have any smokeless tobacco history on file. She reports that she does not drink alcohol or use illicit drugs.  ROS:                                        Physical Exam: BP 131/84 mmHg  Pulse 64  Ht  (1.676 m)  Wt 210 lb 14.4 oz (95.664 kg)  BMI 34.06 kg/m2  Constitutional:  Alert and oriented, No acute distress. HEENT: [place] AT, moist mucus membranes.  Trachea midline, no masses. Cardiovascular: No clubbing, cyanosis, or edema. Respiratory: Normal respiratory effort, no increased work of breathing. GI: Abdomen is soft, nontender, nondistended, no abdominal masses GU: No CVA tenderness.  Skin: No rashes, bruises or suspicious lesions. Lymph: No cervical or inguinal adenopathy. Neurologic: Grossly intact, no focal deficits, moving all 4 extremities. Psychiatric: Normal mood and affect.  Laboratory Data: Lab Results  Component Value Date   WBC 4.9 01/15/2015   HGB 12.5 01/15/2015   HCT 38.2 01/15/2015   MCV 88.4 01/15/2015   PLT 173 01/15/2015    Lab Results  Component Value Date   CREATININE 0.62 01/15/2015    No results found for: PSA  No results found for: TESTOSTERONE  No results found for: HGBA1C  Urinalysis    Component Value Date/Time   GLUCOSEU Negative 07/09/2015 1017   BILIRUBINUR Negative 07/09/2015 1017   NITRITE Negative 07/09/2015 1017   LEUKOCYTESUR Trace* 07/09/2015 1017     Assessment & Plan:    1.  Nephrolithiasis The patient has had multiple stone surgeries to clear her left renal stone burden. It appears she still has residual fragments in her left ureter on KUB today at the level of L3, but the remaining stone burden now seems to be otherwise gone. Due to her insurance co-pay, she would like to have any necessary interventions completed prior to the new year. We will have her follow-up in approximately 3-4 weeks for a KUB. If there is still  residual stone fragment, we will set her up for left lithotripsy prior to the end of the year.   Return in about 3 weeks (around 08/11/2015) for with KUB prior.  Hildred Laser, MD  [facility] [address], [address] [place], [place] [address] [phone]

## 2015-07-28 ENCOUNTER — Encounter: Payer: Self-pay | Admitting: Urology

## 2015-07-31 ENCOUNTER — Encounter: Payer: Self-pay | Admitting: Urology

## 2015-08-04 DIAGNOSIS — Z0000 Encounter for general adult medical examination without abnormal findings: Secondary | ICD-10-CM | POA: Insufficient documentation

## 2015-08-08 ENCOUNTER — Ambulatory Visit
Admission: RE | Admit: 2015-08-08 | Discharge: 2015-08-08 | Disposition: A | Discharge status: Home / Self Care | Payer: 59 | Source: Ambulatory Visit | Attending: Urology | Admitting: Urology

## 2015-08-08 ENCOUNTER — Encounter: Payer: Self-pay | Admitting: Urology

## 2015-08-08 ENCOUNTER — Ambulatory Visit (INDEPENDENT_AMBULATORY_CARE_PROVIDER_SITE_OTHER): Payer: 59 | Admitting: Urology

## 2015-08-08 VITALS — BP 144/79 | HR 56 | Ht 64.0 in | Wt 209.1 lb

## 2015-08-08 DIAGNOSIS — N200 Calculus of kidney: Secondary | ICD-10-CM

## 2015-08-08 LAB — URINALYSIS, COMPLETE
Bilirubin, UA: NEGATIVE
Glucose, UA: NEGATIVE
KETONES UA: NEGATIVE
Leukocytes, UA: NEGATIVE
NITRITE UA: NEGATIVE
PROTEIN UA: NEGATIVE
RBC, UA: NEGATIVE
SPEC GRAV UA: 1.025 (ref 1.005–1.030)
UUROB: 0.2 mg/dL (ref 0.2–1.0)
pH, UA: 5.5 (ref 5.0–7.5)

## 2015-08-08 LAB — MICROSCOPIC EXAMINATION: RBC MICROSCOPIC, UA: NONE SEEN /HPF (ref 0–?)

## 2015-08-08 NOTE — Progress Notes (Signed)
08/08/2015 3:09 PM   Laurie Dickson 05/06/1951 [id number]  Referring provider: Lauro RegulusMarshall W Anderson, MD [address] [facility] [name], [place] [id number]  Chief Complaint  Patient presents with  . Calculus of kidney    recheck    HPI: 64 year old female with a history of nephrolithiasis status post multiple procedures over the past few months for fairly significant left-sided stone burden including an obstructing ureteral stone at the level of L3. She was originally taken to the OR on 01/20/2015 for left ureteroscopy, LL, stent at which time ureteral perforation was noted. Her stent was subsequently removed. Follow-up imaging demonstrated no evidence of residual hydroureteronephrosis on 02/28/2015. KUB showed significant upper tract stone burden along with possible residual fragment at L3.   She underwent ESWL on 03/13/2015 to treat her residual stone burden. Both her ureteral fragment as well as some upper tract stones were treated during that session.  November 2016 Interval History: She returns today after undergoing lithotripsy for a left renal stone on 06/17/2015. KUB from today shows resolved left renal stone. There appears to be stone in her proximal left ureter which is also present on her last KUB at the level of L3. It is difficult to tell if this is one stone or stone fragments.  Postoperatively, she has done well. She denies any pain. She has had no nausea, vomiting, fevers, or chills.  December 2016 interval history: She is a normal since her last appointment. However, on her KUB her stone near the level of L3 still present. She has not passed stone fragments. She is interested in proceeding with ESWL prior to the end of the year due to her co-pay.  PMH: Past Medical History  Diagnosis Date  . RLS (restless legs syndrome)   . Depression   . Hypertension   . Chronic kidney disease     stones,hydronephrosis  . Edema extremities   . Diabetes  mellitus without complication (HCC)     Resolved since bariatric surgery  . Panic attacks   . Sleep apnea   . Obesity   . Depression   . Kidney stone   . Edema   . Diabetes (HCC)   . Hydronephrosis   . Calculus of kidney 01/25/2013  . Hematuria, microscopic 01/12/2013    Surgical History: Past Surgical History  Procedure Laterality Date  . Abdominal hysterectomy    . Back surgery    . Bariatric surgery    . Oophorectomy    . Salpingectomy    . Ureteroscopy with holmium laser lithotripsy Left 01/20/2015    Procedure: URETEROSCOPY WITH HOLMIUM LASER LITHOTRIPSY;  Surgeon: Lorraine Laxichard D Hart, MD;  Location: [facility];  Service: Urology;  Laterality: Left;  . Cystoscopy w/ retrogrades Left 01/20/2015    Procedure: CYSTOSCOPY WITH RETROGRADE PYELOGRAM;  Surgeon: Lorraine Laxichard D Hart, MD;  Location: [facility];  Service: Urology;  Laterality: Left;  . Cystoscopy with stent placement N/A 01/20/2015    Procedure: CYSTOSCOPY WITH STENT PLACEMENT;  Surgeon: Lorraine Laxichard D Hart, MD;  Location: [facility];  Service: Urology;  Laterality: N/A;  . Extracorporeal shock wave lithotripsy Left 04/24/2015    Procedure: EXTRACORPOREAL SHOCK WAVE LITHOTRIPSY (ESWL);  Surgeon: Lorraine Laxichard D Hart, MD;  Location: [facility];  Service: Urology;  Laterality: Left;  . Extracorporeal shock wave lithotripsy Right 03/13/2015    Procedure: EXTRACORPOREAL SHOCK WAVE LITHOTRIPSY (ESWL);  Surgeon: Vanna ScotlandAshley Brandon, MD;  Location: [facility];  Service: Urology;  Laterality: Right;  . Extracorporeal shock wave  lithotripsy Left 07/03/2015    Procedure: EXTRACORPOREAL SHOCK WAVE LITHOTRIPSY (ESWL);  Surgeon: Lorraine Lax, MD;  Location: [facility];  Service: Urology;  Laterality: Left;    Home Medications:    Medication List       This list is accurate as of: 08/08/15  3:09 PM.  Always use your most recent med list.               aspirin EC 81 MG tablet  Take by mouth.     oxyCODONE-acetaminophen 5-325 MG tablet  Commonly known as:   PERCOCET/ROXICET  TK 1 T PO Q 6 H PRN P     rOPINIRole 0.5 MG tablet  Commonly known as:  REQUIP  Take 100 mg by mouth 1 day or 1 dose.        Allergies:  Allergies  Allergen Reactions  . Sulfa Antibiotics Rash    Family History: Family History  Problem Relation Age of Onset  . Kidney cancer Father   . Bladder Cancer Neg Hx     Social History:  reports that she quit smoking about 4 years ago. She does not have any smokeless tobacco history on file. She reports that she does not drink alcohol or use illicit drugs.  ROS: UROLOGY Frequent Urination?: No Hard to postpone urination?: No Burning/pain with urination?: No Get up at night to urinate?: No Leakage of urine?: No Urine stream starts and stops?: No Trouble starting stream?: No Do you have to strain to urinate?: No Blood in urine?: No Urinary tract infection?: No Sexually transmitted disease?: No Injury to kidneys or bladder?: No Painful intercourse?: No Weak stream?: No Currently pregnant?: No Vaginal bleeding?: No Last menstrual period?: n  Gastrointestinal Nausea?: No Vomiting?: No Indigestion/heartburn?: No Diarrhea?: No Constipation?: No  Constitutional Fever: No Night sweats?: No Weight loss?: No Fatigue?: No  Skin Skin rash/lesions?: No Itching?: No  Eyes Blurred vision?: No Double vision?: No  Ears/Nose/Throat Sore throat?: No Sinus problems?: No  Hematologic/Lymphatic Swollen glands?: No Easy bruising?: No  Cardiovascular Leg swelling?: No Chest pain?: No  Respiratory Cough?: No Shortness of breath?: No  Endocrine Excessive thirst?: No  Musculoskeletal Back pain?: No Joint pain?: No  Neurological Headaches?: No Dizziness?: No  Psychologic Depression?: No Anxiety?: No  Physical Exam: BP 144/79 mmHg  Pulse 56  Ht  (1.626 m)  Wt 209 lb 1.6 oz (94.847 kg)  BMI 35.87 kg/m2  Constitutional:  Alert and oriented, No acute distress. HEENT: [place] AT, moist  mucus membranes.  Trachea midline, no masses. Cardiovascular: No clubbing, cyanosis, or edema. Respiratory: Normal respiratory effort, no increased work of breathing. GI: Abdomen is soft, nontender, nondistended, no abdominal masses GU: No CVA tenderness.  Skin: No rashes, bruises or suspicious lesions. Lymph: No cervical or inguinal adenopathy. Neurologic: Grossly intact, no focal deficits, moving all 4 extremities. Psychiatric: Normal mood and affect.  Laboratory Data: Lab Results  Component Value Date   WBC 4.9 01/15/2015   HGB 12.5 01/15/2015   HCT 38.2 01/15/2015   MCV 88.4 01/15/2015   PLT 173 01/15/2015    Lab Results  Component Value Date   CREATININE 0.62 01/15/2015    No results found for: PSA  No results found for: TESTOSTERONE  No results found for: HGBA1C  Urinalysis    Component Value Date/Time   GLUCOSEU Negative 07/18/2015 1411   BILIRUBINUR Negative 07/18/2015 1411   NITRITE Negative 07/18/2015 1411   LEUKOCYTESUR Trace* 07/18/2015 1411    Assessment &  Plan:    1. Nephrolithiasis The patient has had multiple stone surgeries to clear her left renal stone burden. It appears she still has residual fragments in her left ureter on KUB today at the level of L3, but the remaining stone burden now seems to be otherwise gone. She is very interested in proceeding with left ESWL to clear her remaining stone burden prior to the new year because she has no co-pay. She is aware of the risks, benefits, and indications of this procedure. She has been through multiple times. She is willing to proceed with left ESWL.   No Follow-up on file.  Hildred Laser, MD  [facility] [address] [place], [place] [address] [phone]

## 2015-08-11 ENCOUNTER — Telehealth: Payer: Self-pay

## 2015-08-11 NOTE — Telephone Encounter (Signed)
-----   Message from Harle BattiestShannon A McGowan, PA-C sent at 08/10/2015  5:45 PM EST ----- According to Dr. Jorja LoaBudzyn's last note, she is going to have ESWL.  Was there supposed to be an urine culture as well?

## 2015-08-11 NOTE — Telephone Encounter (Signed)
Please advise 

## 2015-08-11 NOTE — Telephone Encounter (Signed)
Can we send a urine culture please

## 2015-08-21 ENCOUNTER — Ambulatory Visit: Payer: 59

## 2015-08-21 ENCOUNTER — Encounter
Admission: RE | Disposition: A | Discharge status: Home / Self Care | Payer: Self-pay | Source: Ambulatory Visit | Attending: Urology

## 2015-08-21 ENCOUNTER — Encounter: Payer: Self-pay | Admitting: *Deleted

## 2015-08-21 ENCOUNTER — Ambulatory Visit
Admission: RE | Admit: 2015-08-21 | Discharge: 2015-08-21 | Disposition: A | Discharge status: Home / Self Care | Payer: 59 | Source: Ambulatory Visit | Attending: Urology | Admitting: Urology

## 2015-08-21 DIAGNOSIS — Z87442 Personal history of urinary calculi: Secondary | ICD-10-CM | POA: Insufficient documentation

## 2015-08-21 DIAGNOSIS — R3129 Other microscopic hematuria: Secondary | ICD-10-CM | POA: Insufficient documentation

## 2015-08-21 DIAGNOSIS — Z539 Procedure and treatment not carried out, unspecified reason: Secondary | ICD-10-CM | POA: Diagnosis not present

## 2015-08-21 DIAGNOSIS — N132 Hydronephrosis with renal and ureteral calculous obstruction: Secondary | ICD-10-CM | POA: Insufficient documentation

## 2015-08-21 DIAGNOSIS — N200 Calculus of kidney: Secondary | ICD-10-CM

## 2015-08-21 DIAGNOSIS — R319 Hematuria, unspecified: Secondary | ICD-10-CM

## 2015-08-21 HISTORY — PX: EXTRACORPOREAL SHOCK WAVE LITHOTRIPSY: SHX1557

## 2015-08-21 SURGERY — LITHOTRIPSY, ESWL
Anesthesia: Moderate Sedation | Laterality: Left

## 2015-08-21 MED ORDER — DIPHENHYDRAMINE HCL 25 MG PO CAPS
25.0000 mg | ORAL_CAPSULE | ORAL | Status: AC
  Administered 2015-08-21: 25 mg via ORAL

## 2015-08-21 MED ORDER — CIPROFLOXACIN HCL 500 MG PO TABS
ORAL_TABLET | ORAL | Status: AC
  Administered 2015-08-21: 500 mg via ORAL
  Filled 2015-08-21: qty 1

## 2015-08-21 MED ORDER — ONDANSETRON HCL 4 MG/2ML IJ SOLN
INTRAMUSCULAR | Status: AC
  Administered 2015-08-21: 4 mg via INTRAVENOUS
  Filled 2015-08-21: qty 2

## 2015-08-21 MED ORDER — MIDAZOLAM HCL 2 MG/2ML IJ SOLN
INTRAMUSCULAR | Status: AC
  Administered 2015-08-21: 1 mg via INTRAMUSCULAR
  Filled 2015-08-21: qty 2

## 2015-08-21 MED ORDER — DEXTROSE-NACL 5-0.45 % IV SOLN
INTRAVENOUS | Status: DC
  Administered 2015-08-21: 09:00:00 via INTRAVENOUS

## 2015-08-21 MED ORDER — DIPHENHYDRAMINE HCL 25 MG PO CAPS
ORAL_CAPSULE | ORAL | Status: AC
  Administered 2015-08-21: 25 mg via ORAL
  Filled 2015-08-21: qty 1

## 2015-08-21 MED ORDER — MIDAZOLAM HCL 2 MG/2ML IJ SOLN
1.0000 mg | Freq: Once | INTRAMUSCULAR | Status: AC
  Administered 2015-08-21: 1 mg via INTRAMUSCULAR

## 2015-08-21 MED ORDER — CIPROFLOXACIN HCL 500 MG PO TABS
500.0000 mg | ORAL_TABLET | Freq: Once | ORAL | Status: AC
  Administered 2015-08-21: 500 mg via ORAL

## 2015-08-21 MED ORDER — ONDANSETRON HCL 4 MG/2ML IJ SOLN
4.0000 mg | Freq: Once | INTRAMUSCULAR | Status: AC
  Administered 2015-08-21: 4 mg via INTRAVENOUS

## 2015-08-21 MED ORDER — IOHEXOL 350 MG/ML SOLN
125.0000 mL | Freq: Once | INTRAVENOUS | Status: AC | PRN
  Administered 2015-08-21: 125 mL via INTRAVENOUS

## 2015-08-21 NOTE — OR Nursing (Signed)
Instructed to eat small amounts due to the premed she received even if she had no procedure the medication could make her nauseated if she eats to much. Dr. Apolinar JunesBrandon filled out paperwork for FMLA and Ok'd dc home.

## 2015-08-22 ENCOUNTER — Telehealth: Payer: Self-pay | Admitting: Urology

## 2015-08-22 ENCOUNTER — Encounter: Payer: Self-pay | Admitting: Urology

## 2015-08-22 NOTE — Telephone Encounter (Signed)
Patient initially scheduled for ESWL 08/21/2015 for left ureteral stone. Upon review of her chart and previous history, she's already been treated several times with ESWL for this stone and she does have a history of ureteral perforation/trauma at this site. As such, I did not feel comfortable proceeding with ESWL as previously scheduled. This was discussed at length with the patient today. There is also a question of whether or not this 10  Mm stone was intraluminal within the ureter.    She underwent CT urogram on 08/21/2015 which did prove that the 10 mm left ureteral stone was intraluminal. It also showed sequela of possible left perinephric hematoma from previous shock wave treatments.  We had a lengthy discussion regarding the CT scan results and how to proceed moving forward. I do think that she would be best served by attempting to treat this stone ureteroscopically especially given her history of failed ESWL. She understands that there is a risk for needing multiple endoscopic procedures. She would like to go ahead and get booked for this.  Vanna ScotlandAshley Rolla Kedzierski, MD

## 2015-08-28 ENCOUNTER — Telehealth: Payer: Self-pay | Admitting: Radiology

## 2015-08-28 NOTE — Telephone Encounter (Signed)
Notified pt of surgery scheduled for 09/10/15, pre-admit testing appt on 08/29/15 @10 :30 and to call day prior to surgery for arrival time to [facility]. Pt voices understanding.

## 2015-08-28 NOTE — Telephone Encounter (Signed)
LM for pt to return call. Need to discuss surgery scheduled 09/10/15.

## 2015-08-29 ENCOUNTER — Other Ambulatory Visit: Payer: 59

## 2015-09-02 ENCOUNTER — Encounter
Admission: RE | Admit: 2015-09-02 | Discharge: 2015-09-02 | Disposition: A | Discharge status: Home / Self Care | Payer: 59 | Source: Ambulatory Visit | Attending: Urology | Admitting: Urology

## 2015-09-02 DIAGNOSIS — Z01818 Encounter for other preprocedural examination: Secondary | ICD-10-CM | POA: Insufficient documentation

## 2015-09-02 LAB — BASIC METABOLIC PANEL
Anion gap: 4 — ABNORMAL LOW (ref 5–15)
BUN: 18 mg/dL (ref 6–20)
CALCIUM: 8.9 mg/dL (ref 8.9–10.3)
CO2: 30 mmol/L (ref 22–32)
CREATININE: 0.72 mg/dL (ref 0.44–1.00)
Chloride: 108 mmol/L (ref 101–111)
GFR calc Af Amer: >60 mL/min (ref >60)
GFR calc non Af Amer: >60 mL/min (ref >60)
GLUCOSE: 70 mg/dL (ref 65–99)
Potassium: 4.1 mmol/L (ref 3.5–5.1)
Sodium: 142 mmol/L (ref 135–145)

## 2015-09-02 NOTE — Patient Instructions (Signed)
  Your procedure is scheduled on: Wednesday 09/10/2015 Report to Day Surgery. 2nd floor medical mall entrance To find out your arrival time please call [phone] between 1PM - 3PM on Tuesday 09/09/2015.  Remember: Instructions that are not followed completely may result in serious medical risk, up to and including death, or upon the discretion of your surgeon and anesthesiologist your surgery may need to be rescheduled.    __X__ 1. Do not eat food or drink liquids after midnight. No gum chewing or hard candies.     __X__ 2. No Alcohol for 24 hours before or after surgery.   ____ 3. Bring all medications with you on the day of surgery if instructed.    __X__ 4. Notify your doctor if there is any change in your medical condition     (cold, fever, infections).     Do not wear jewelry, make-up, hairpins, clips or nail polish.  Do not wear lotions, powders, or perfumes. You may wear deodorant.  Do not shave 48 hours prior to surgery. Men may shave face and neck.  Do not bring valuables to the hospital.    [facility] is not responsible for any belongings or valuables.               Contacts, dentures or bridgework may not be worn into surgery.  Leave your suitcase in the car. After surgery it may be brought to your room.  For patients admitted to the hospital, discharge time is determined by your                treatment team.   Patients discharged the day of surgery will not be allowed to drive home.   Please read over the following fact sheets that you were given:   Surgical Site Infection Prevention   ____ Take these medicines the morning of surgery with A SIP OF WATER:    1.   2.   3.   4.  5.  6.  ____ Fleet Enema (as directed)   ____ Use CHG Soap as directed  ____ Use inhalers on the day of surgery  ____ Stop metformin 2 days prior to surgery    ____ Take 1/2 of usual insulin dose the night before surgery and none on the morning of surgery.   ____ Stop  Coumadin/Plavix/aspirin on   ____ Stop Anti-inflammatories on    ____ Stop supplements until after surgery.    ____ Bring C-Pap to the hospital.

## 2015-09-03 LAB — URINE CULTURE

## 2015-09-04 ENCOUNTER — Telehealth: Payer: Self-pay | Admitting: Radiology

## 2015-09-04 ENCOUNTER — Ambulatory Visit (INDEPENDENT_AMBULATORY_CARE_PROVIDER_SITE_OTHER): Payer: 59

## 2015-09-04 ENCOUNTER — Telehealth: Payer: Self-pay

## 2015-09-04 DIAGNOSIS — R339 Retention of urine, unspecified: Secondary | ICD-10-CM

## 2015-09-04 DIAGNOSIS — N200 Calculus of kidney: Secondary | ICD-10-CM

## 2015-09-04 NOTE — Telephone Encounter (Signed)
-----   Message from Vanna ScotlandAshley Brandon, MD sent at 09/03/2015 11:24 AM EST ----- Need repeat sample (UA/ Ucx) due to presence of contamination.  It would be best if this a cathed specimen and needs to happen in time for surgery (ideally this week).      Vanna ScotlandAshley Brandon, MD

## 2015-09-04 NOTE — Telephone Encounter (Signed)
LMOM for pt to return call. Pt needs repeat UA/UCX via cathed specimen this week.   Notes Recorded by Vanna ScotlandAshley Brandon, MD on 09/03/2015 at 11:24 AM Need repeat sample (UA/ Ucx) due to presence of contamination. It would be best if this a cathed specimen and needs to happen in time for surgery (ideally this week).

## 2015-09-04 NOTE — Progress Notes (Signed)
In and Out Catheterization  Patient is present today for a I & O catheterization due to cath specimen prior to surgery. Patient was cleaned and prepped in a sterile fashion with betadine and Lidocaine 2% jelly was instilled into the urethra.  A 14FR cath was inserted no complications were noted , 50ml of urine return was noted, urine was yellow in color. A clean urine sample was collected for ucx. Bladder was drained  And catheter was removed with out difficulty.    Preformed by: Rupert Stackshelsea Watkins, LPN

## 2015-09-04 NOTE — Telephone Encounter (Signed)
Pt is coming in today for a cath specimen.  

## 2015-09-04 NOTE — Telephone Encounter (Signed)
Advised pt she needs a cathed specimen for a repeat UA/UCX. Appt made for 1/5 @ 4:15. Pt aware.

## 2015-09-04 NOTE — Addendum Note (Signed)
Addended by: Rupert StacksWATKINS, CHELSEA C on: 09/04/2015 05:00 PM   Modules accepted: Orders

## 2015-09-04 NOTE — Pre-Procedure Instructions (Signed)
Urine culture multiple species present, suggest recollection. Faxed the results to Dr. Apolinar JunesBrandon and asked if we needed to repeat urine culture.

## 2015-09-06 LAB — CULTURE, URINE COMPREHENSIVE

## 2015-09-09 ENCOUNTER — Telehealth: Payer: Self-pay | Admitting: Radiology

## 2015-09-09 NOTE — Telephone Encounter (Signed)
Pt notified of negative ucx & that we'll plan to proceed with surgery scheduled 09/10/15. Pt voices understanding & states she has already called for her arrival time to [facility].

## 2015-09-10 ENCOUNTER — Ambulatory Visit: Payer: 59 | Admitting: Anesthesiology

## 2015-09-10 ENCOUNTER — Ambulatory Visit
Admission: RE | Admit: 2015-09-10 | Discharge: 2015-09-10 | Disposition: A | Discharge status: Home / Self Care | Payer: 59 | Source: Ambulatory Visit | Attending: Urology | Admitting: Urology

## 2015-09-10 ENCOUNTER — Encounter
Admission: RE | Disposition: A | Discharge status: Home / Self Care | Payer: Self-pay | Source: Ambulatory Visit | Attending: Urology

## 2015-09-10 ENCOUNTER — Encounter: Payer: Self-pay | Admitting: *Deleted

## 2015-09-10 DIAGNOSIS — G2581 Restless legs syndrome: Secondary | ICD-10-CM | POA: Insufficient documentation

## 2015-09-10 DIAGNOSIS — Z9889 Other specified postprocedural states: Secondary | ICD-10-CM | POA: Insufficient documentation

## 2015-09-10 DIAGNOSIS — N189 Chronic kidney disease, unspecified: Secondary | ICD-10-CM | POA: Insufficient documentation

## 2015-09-10 DIAGNOSIS — Z882 Allergy status to sulfonamides status: Secondary | ICD-10-CM | POA: Diagnosis not present

## 2015-09-10 DIAGNOSIS — N132 Hydronephrosis with renal and ureteral calculous obstruction: Secondary | ICD-10-CM | POA: Insufficient documentation

## 2015-09-10 DIAGNOSIS — Z9884 Bariatric surgery status: Secondary | ICD-10-CM | POA: Insufficient documentation

## 2015-09-10 DIAGNOSIS — Z8051 Family history of malignant neoplasm of kidney: Secondary | ICD-10-CM | POA: Insufficient documentation

## 2015-09-10 DIAGNOSIS — N201 Calculus of ureter: Secondary | ICD-10-CM | POA: Diagnosis not present

## 2015-09-10 DIAGNOSIS — E669 Obesity, unspecified: Secondary | ICD-10-CM | POA: Diagnosis not present

## 2015-09-10 DIAGNOSIS — Z87442 Personal history of urinary calculi: Secondary | ICD-10-CM | POA: Diagnosis not present

## 2015-09-10 DIAGNOSIS — Z90710 Acquired absence of both cervix and uterus: Secondary | ICD-10-CM | POA: Insufficient documentation

## 2015-09-10 DIAGNOSIS — I129 Hypertensive chronic kidney disease with stage 1 through stage 4 chronic kidney disease, or unspecified chronic kidney disease: Secondary | ICD-10-CM | POA: Insufficient documentation

## 2015-09-10 DIAGNOSIS — Z79899 Other long term (current) drug therapy: Secondary | ICD-10-CM | POA: Insufficient documentation

## 2015-09-10 DIAGNOSIS — N200 Calculus of kidney: Secondary | ICD-10-CM

## 2015-09-10 DIAGNOSIS — Z87891 Personal history of nicotine dependence: Secondary | ICD-10-CM | POA: Insufficient documentation

## 2015-09-10 HISTORY — DX: Failed or difficult intubation, initial encounter: T88.4XXA

## 2015-09-10 HISTORY — PX: CYSTOSCOPY/URETEROSCOPY/HOLMIUM LASER/STENT PLACEMENT: SHX6546

## 2015-09-10 LAB — GLUCOSE, CAPILLARY
GLUCOSE-CAPILLARY: 83 mg/dL (ref 65–99)
Glucose-Capillary: 70 mg/dL (ref 65–99)

## 2015-09-10 SURGERY — CYSTOSCOPY/URETEROSCOPY/HOLMIUM LASER/STENT PLACEMENT
Anesthesia: General | Laterality: Left | Wound class: Clean Contaminated

## 2015-09-10 MED ORDER — OXYBUTYNIN CHLORIDE 5 MG PO TABS: 5.0000 mg | ORAL_TABLET | Freq: Three times a day (TID) | ORAL | Status: DC | PRN

## 2015-09-10 MED ORDER — SUCCINYLCHOLINE CHLORIDE 20 MG/ML IJ SOLN
INTRAMUSCULAR | Status: DC | PRN
  Administered 2015-09-10: 100 mg via INTRAVENOUS

## 2015-09-10 MED ORDER — DEXAMETHASONE SODIUM PHOSPHATE 10 MG/ML IJ SOLN
INTRAMUSCULAR | Status: DC | PRN
  Administered 2015-09-10: 5 mg via INTRAVENOUS

## 2015-09-10 MED ORDER — OXYCODONE HCL 5 MG/5ML PO SOLN: 5.0000 mg | Freq: Once | ORAL | Status: DC | PRN

## 2015-09-10 MED ORDER — HYDROCODONE-ACETAMINOPHEN 5-325 MG PO TABS: 1.0000 | ORAL_TABLET | Freq: Four times a day (QID) | ORAL | Status: DC | PRN

## 2015-09-10 MED ORDER — FAMOTIDINE 20 MG PO TABS
ORAL_TABLET | ORAL | Status: AC
  Filled 2015-09-10: qty 1

## 2015-09-10 MED ORDER — ONDANSETRON HCL 4 MG/2ML IJ SOLN
INTRAMUSCULAR | Status: DC | PRN
  Administered 2015-09-10: 4 mg via INTRAVENOUS

## 2015-09-10 MED ORDER — IOTHALAMATE MEGLUMINE 43 % IV SOLN
INTRAVENOUS | Status: DC | PRN
  Administered 2015-09-10: 15 mL

## 2015-09-10 MED ORDER — OXYCODONE HCL 5 MG PO TABS: 5.0000 mg | ORAL_TABLET | Freq: Once | ORAL | Status: DC | PRN

## 2015-09-10 MED ORDER — SODIUM CHLORIDE 0.9 % IV SOLN: 1.0000 g | Freq: Once | INTRAVENOUS | Status: DC

## 2015-09-10 MED ORDER — FAMOTIDINE 20 MG PO TABS: 20.0000 mg | ORAL_TABLET | Freq: Once | ORAL | Status: DC

## 2015-09-10 MED ORDER — FENTANYL CITRATE (PF) 100 MCG/2ML IJ SOLN
25.0000 ug | INTRAMUSCULAR | Status: DC | PRN
Start: 2015-09-10 — End: 2015-09-10

## 2015-09-10 MED ORDER — GENTAMICIN IN SALINE 1.6-0.9 MG/ML-% IV SOLN
80.0000 mg | Freq: Once | INTRAVENOUS | Status: AC
  Administered 2015-09-10: 80 mg via INTRAVENOUS
  Filled 2015-09-10: qty 50

## 2015-09-10 MED ORDER — GENTAMICIN SULFATE 40 MG/ML IJ SOLN
80.0000 mg | Freq: Once | INTRAVENOUS | Status: DC
  Filled 2015-09-10: qty 2

## 2015-09-10 MED ORDER — FENTANYL CITRATE (PF) 100 MCG/2ML IJ SOLN
INTRAMUSCULAR | Status: DC | PRN
  Administered 2015-09-10: 100 ug via INTRAVENOUS

## 2015-09-10 MED ORDER — LIDOCAINE HCL (CARDIAC) 20 MG/ML IV SOLN
INTRAVENOUS | Status: DC | PRN
Start: 2015-09-10 — End: 2015-09-10
  Administered 2015-09-10: 60 mg via INTRAVENOUS

## 2015-09-10 MED ORDER — GLYCOPYRROLATE 0.2 MG/ML IJ SOLN
INTRAMUSCULAR | Status: DC | PRN
  Administered 2015-09-10: .6 mg via INTRAVENOUS

## 2015-09-10 MED ORDER — SODIUM CHLORIDE 0.9 % IV SOLN
INTRAVENOUS | Status: AC
  Administered 2015-09-10: 1 g via INTRAVENOUS
  Filled 2015-09-10: qty 1000

## 2015-09-10 MED ORDER — ROCURONIUM BROMIDE 100 MG/10ML IV SOLN
INTRAVENOUS | Status: DC | PRN
  Administered 2015-09-10: 15 mg via INTRAVENOUS
  Administered 2015-09-10: 5 mg via INTRAVENOUS

## 2015-09-10 MED ORDER — MIDAZOLAM HCL 5 MG/5ML IJ SOLN
INTRAMUSCULAR | Status: DC | PRN
  Administered 2015-09-10: 2 mg via INTRAVENOUS

## 2015-09-10 MED ORDER — NEOSTIGMINE METHYLSULFATE 10 MG/10ML IV SOLN
INTRAVENOUS | Status: DC | PRN
  Administered 2015-09-10: 3 mg via INTRAVENOUS

## 2015-09-10 MED ORDER — SODIUM CHLORIDE 0.9 % IV SOLN
INTRAVENOUS | Status: DC
  Administered 2015-09-10: 11:00:00 via INTRAVENOUS

## 2015-09-10 MED ORDER — PROPOFOL 10 MG/ML IV BOLUS
INTRAVENOUS | Status: DC | PRN
  Administered 2015-09-10: 150 mg via INTRAVENOUS

## 2015-09-10 SURGICAL SUPPLY — 27 items
ADAPTER SCOPE UROLOK II (MISCELLANEOUS) IMPLANT
BAG DRAIN CYSTO-URO LG1000N (MISCELLANEOUS) ×2 IMPLANT
BASKET ZERO TIP 1.9FR (BASKET) ×2 IMPLANT
CATH URETL 5X70 OPEN END (CATHETERS) ×2 IMPLANT
CNTNR SPEC 2.5X3XGRAD LEK (MISCELLANEOUS)
CONRAY 43 FOR UROLOGY 50M (MISCELLANEOUS) ×2 IMPLANT
CONT SPEC 4OZ STER OR WHT (MISCELLANEOUS)
CONTAINER SPEC 2.5X3XGRAD LEK (MISCELLANEOUS) IMPLANT
GLOVE BIO SURGEON STRL SZ 6.5 (GLOVE) ×2 IMPLANT
GLOVE BIO SURGEON STRL SZ7 (GLOVE) ×4 IMPLANT
GOWN STRL REUS W/ TWL LRG LVL3 (GOWN DISPOSABLE) ×2 IMPLANT
GOWN STRL REUS W/TWL LRG LVL3 (GOWN DISPOSABLE) ×2
INTRODUCER DILATOR DOUBLE (INTRODUCER) IMPLANT
KIT RM TURNOVER CYSTO AR (KITS) ×2 IMPLANT
PACK CYSTO AR (MISCELLANEOUS) ×2 IMPLANT
PREP PVP WINGED SPONGE (MISCELLANEOUS) ×2 IMPLANT
PUMP SINGLE ACTION SAP (PUMP) IMPLANT
SENSORWIRE 0.038 NOT ANGLED (WIRE) ×2
SET CYSTO W/LG BORE CLAMP LF (SET/KITS/TRAYS/PACK) ×2 IMPLANT
SHEATH URETERAL 12FRX35CM (MISCELLANEOUS) IMPLANT
SOL .9 NS 3000ML IRR  AL (IV SOLUTION) ×1
SOL .9 NS 3000ML IRR UROMATIC (IV SOLUTION) ×1 IMPLANT
STENT URET 6FRX24 CONTOUR (STENTS) ×2 IMPLANT
STENT URET 6FRX26 CONTOUR (STENTS) IMPLANT
SURGILUBE 2OZ TUBE FLIPTOP (MISCELLANEOUS) ×2 IMPLANT
WATER STERILE IRR 1000ML POUR (IV SOLUTION) ×2 IMPLANT
WIRE SENSOR 0.038 NOT ANGLED (WIRE) ×1 IMPLANT

## 2015-09-10 NOTE — Op Note (Signed)
Date of procedure: 09/10/2015  Preoperative diagnosis:  1. Left ureteral stone   Postoperative diagnosis:  1. Left ureteral stone 2. Left intraluminal or extraluminal calcification   Procedure: 1. Left ureteroscopy 2. Retrograde pyelogram (left) 3. Basket extraction of stone fragment 4. Left ureteral stent placement (on a string)  Surgeon: Vanna Scotland, MD  Anesthesia: General  Complications: None  Intraoperative findings: Tiny 1 mm stone fragment in the proximal ureter, otherwise no stones identified throughout ureter. 10 mm stone on CT scan is either intraluminal or extraluminal and not within the lumen of the ureter.  EBL: miniaml  Specimens: none  Drains: 6 x 24 French double-J ureteral stent on left (string left in place)  Indication: Laurie Dickson is a 65 y.o. patient with left proximal ureteral stone treated ureteroscopically, located by ureteral perforation followed by several ESWL procedures.  Most recent CT scan suggested that the 10 mm proximal stone was likely intraluminal.  After reviewing the management options for treatment, she elected to proceed with the above surgical procedure(s). We have discussed the potential benefits and risks of the procedure, side effects of the proposed treatment, the likelihood of the patient achieving the goals of the procedure, and any potential problems that might occur during the procedure or recuperation. Informed consent has been obtained.  Description of procedure:  The patient was taken to the operating room and general anesthesia was induced.  The patient was placed in the dorsal lithotomy position, prepped and draped in the usual sterile fashion, and preoperative antibiotics were administered. A preoperative time-out was performed.   At this point in time, a rigid 21 French rigid cystoscope was advanced per urethra into the bladder. Attention was turned to the left ureteral orifice which was cannulated using a 5 [place]  open-ended ureteral catheter just within the UO. A gentle retrograde pyelogram was performed which revealed a delicate appearing ureter filling the entire length of the ureter. There was some very subtle hourglass contour in the proximal ureter where the stone had been identified on CT scan but no significant stricture. The renal pelvis filled with contrast nicely, no filling defects or hydronephrosis.  At this point in time, a sensor wire was placed up to level of the kidney without difficulty. A 4.5 French needle semi rigid cystoscope was then advanced alongside of the wire of the entire length of the ureter to the area of narrowing seen on the retrograde. A very small 1 mm calcification was seen within the lumen of the ureter which was extracted using a basket. The scope was then able to be easily placed beyond this area all the way up to the level of the renal pelvis without much difficulty. As the scope was removed, extremely careful examination of the wall of the ureter was performed to ensure that there was no evidence of embedded or impacted ureteral stone. The mucosa appeared intact throughout the entire length of the ureter.  Images were captured of the areas of subtle narrowing of the proximal ureter for the purposes of documentation and patient education. The scope was then removed completely. A 6 x 24 French double-J ureteral stent was advanced up to level of the kidney over the safety wire. The wire was partially withdrawn until coil was noted within the renal pelvis. The wire was then fully withdrawn and a coil was noted within the bladder. The string was left on the stent. The bladder was drained. The patient was cleaned and dried. The stent string was affixed to  the patient's left inner thigh using Mastisol and Tegaderm. She was then repositioned the supine position, reversed from anesthesia, and taken to the PACU in stable condition.   The case and findings were discussed extensively with the  patient's husband. I explained that the 10 mm stone which appeared to be in the ureter on CT urogram was in fact likely either intramural within the wall of the ureter without causing any degree of ureteral obstruction versus completely outside of the ureter itself likely cause at the time of perforation. I would like to continue to follow her with serial ultrasound to ensure for resolution of her left subcapsular fluid collection as well as to ensure that she does not develop any hydronephrosis if the calcification propagates.  Plan: Patient will follow-up in 3 months with a renal ultrasound for surveillance. She will self remove her stent in 2 days.  Vanna ScotlandAshley Travanti Mcmanus, M.D.

## 2015-09-10 NOTE — Addendum Note (Signed)
Addendum  created 09/10/15 1256 by Lily KocherNicole Raeley Gilmore, CRNA   Modules edited: Anesthesia Medication Administration

## 2015-09-10 NOTE — Anesthesia Postprocedure Evaluation (Signed)
Anesthesia Post Note  Patient: Laurie MuttonCathy S Dickson  Procedure(s) Performed: Procedure(s) (LRB): CYSTOSCOPY/URETEROSCOPY//STENT PLACEMENT/retrograde pyelogram/stone basketing (Left)  Patient location during evaluation: PACU Anesthesia Type: General Level of consciousness: awake and alert Pain management: pain level controlled Vital Signs Assessment: post-procedure vital signs reviewed and stable Respiratory status: spontaneous breathing, nonlabored ventilation, respiratory function stable and patient connected to nasal cannula oxygen Cardiovascular status: blood pressure returned to baseline and stable Postop Assessment: no signs of nausea or vomiting Anesthetic complications: no    Last Vitals:  Filed Vitals:   09/10/15 1230 09/10/15 1245  BP: 129/71 147/75  Pulse: 55 47  Temp:    Resp: 24 14    Last Pain:  Filed Vitals:   09/10/15 1246  PainSc: 0-No pain                 Cleda MccreedyJoseph K Piscitello

## 2015-09-10 NOTE — Discharge Instructions (Signed)
You have a ureteral stent in place.  This is a tube that extends from your kidney to your bladder.  This may cause urinary bleeding, burning with urination, and urinary frequency.  Please call our office or present to the ED if you develop fevers >101 or pain which is not able to be controlled with oral pain medications.  You may be given either Flomax and/ or ditropan to help with bladder spasms and stent pain in addition to pain medications.     Your ureteral stent in on a string taped to your left inner thigh.  On Friday, untape this stent and gently pulled the string until the entire stent was removed. If you have any concerns or difficulty, please contact our office for assistance.    [facility] [address], [address] [place], [place] [address] [phone]    AMBULATORY SURGERY  DISCHARGE INSTRUCTIONS   1) The drugs that you were given will stay in your system until tomorrow so for the next 24 hours you should not:  A) Drive an automobile B) Make any legal decisions C) Drink any alcoholic beverage   2) You may resume regular meals tomorrow.  Today it is better to start with liquids and gradually work up to solid foods.  You may eat anything you prefer, but it is better to start with liquids, then soup and crackers, and gradually work up to solid foods.   3) Please notify your doctor immediately if you have any unusual bleeding, trouble breathing, redness and pain at the surgery site, drainage, fever, or pain not relieved by medication.    4) Additional Instructions:        Please contact your physician with any problems or Same Day Surgery at [phone], Monday through Friday 6 am to 4 pm, or [place] at [facility] Main number at [phone].

## 2015-09-10 NOTE — Transfer of Care (Signed)
Immediate Anesthesia Transfer of Care Note  Patient: Laurie Dickson  Procedure(s) Performed: Procedure(s): CYSTOSCOPY/URETEROSCOPY//STENT PLACEMENT/retrograde pyelogram/stone basketing (Left)  Patient Location: PACU  Anesthesia Type:General  Level of Consciousness: awake and patient cooperative  Airway & Oxygen Therapy: Patient Spontanous Breathing and Patient connected to face mask oxygen  Post-op Assessment: Report given to RN and Post -op Vital signs reviewed and stable  Post vital signs: Reviewed and stable  Last Vitals:  Filed Vitals:   09/10/15 0953 09/10/15 1148  BP: 160/77 137/64  Pulse: 63 64  Temp: 36.9 C 36.3 C  Resp: 16 13    Complications: No apparent anesthesia complications

## 2015-09-10 NOTE — H&P (Signed)
09/10/2015  10:45 AM   Laurie Dickson 1951/02/11 [id number]  HPI: Patient initially scheduled for ESWL 08/21/2015 for left ureteral stone. Upon review of her chart and previous history, she's already been treated several times with ESWL for this stone and she does have a history of ureteral perforation/trauma at this site. As such, I did not feel comfortable proceeding with ESWL as previously scheduled. This was discussed at length with the patient today. There is also a question of whether or not this 10 Mm stone was intraluminal within the ureter.   She underwent CT urogram on 08/21/2015 which did prove that the 10 mm left ureteral stone was intraluminal. It also showed sequela of possible left perinephric hematoma from previous shock wave treatments.  We had a lengthy discussion regarding the CT scan results and how to proceed moving forward. I do think that she would be best served by attempting to treat this stone ureteroscopically especially given her history of failed ESWL. She understands that there is a risk for needing multiple endoscopic procedures. She would like to go ahead and get booked for this.   PMH: Past Medical History  Diagnosis Date  . RLS (restless legs syndrome)   . Hypertension   . Chronic kidney disease     stones,hydronephrosis  . Edema extremities   . Diabetes mellitus without complication (HCC)     Resolved since bariatric surgery  . Sleep apnea   . Obesity   . Kidney stone   . Edema   . Diabetes (HCC)   . Hydronephrosis   . Calculus of kidney 01/25/2013  . Hematuria, microscopic 01/12/2013    Surgical History: Past Surgical History  Procedure Laterality Date  . Abdominal hysterectomy    . Back surgery    . Bariatric surgery    . Oophorectomy    . Salpingectomy    . Ureteroscopy with holmium laser lithotripsy Left 01/20/2015    Procedure: URETEROSCOPY WITH HOLMIUM LASER LITHOTRIPSY;  Surgeon: Lorraine Lax, MD;  Location: [facility]S;  Service:  Urology;  Laterality: Left;  . Cystoscopy w/ retrogrades Left 01/20/2015    Procedure: CYSTOSCOPY WITH RETROGRADE PYELOGRAM;  Surgeon: Lorraine Lax, MD;  Location: [facility];  Service: Urology;  Laterality: Left;  . Cystoscopy with stent placement N/A 01/20/2015    Procedure: CYSTOSCOPY WITH STENT PLACEMENT;  Surgeon: Lorraine Lax, MD;  Location: [facility];  Service: Urology;  Laterality: N/A;  . Extracorporeal shock wave lithotripsy Left 04/24/2015    Procedure: EXTRACORPOREAL SHOCK WAVE LITHOTRIPSY (ESWL);  Surgeon: Lorraine Lax, MD;  Location: [facility];  Service: Urology;  Laterality: Left;  . Extracorporeal shock wave lithotripsy Right 03/13/2015    Procedure: EXTRACORPOREAL SHOCK WAVE LITHOTRIPSY (ESWL);  Surgeon: Vanna Scotland, MD;  Location: [facility];  Service: Urology;  Laterality: Right;  . Extracorporeal shock wave lithotripsy Left 07/03/2015    Procedure: EXTRACORPOREAL SHOCK WAVE LITHOTRIPSY (ESWL);  Surgeon: Lorraine Lax, MD;  Location: [facility];  Service: Urology;  Laterality: Left;  . Extracorporeal shock wave lithotripsy Left 08/21/2015    Procedure: EXTRACORPOREAL SHOCK WAVE LITHOTRIPSY (ESWL);  Surgeon: Vanna Scotland, MD;  Location: [facility];  Service: Urology;  Laterality: Left;    Home Medications:    Medication List    ASK your doctor about these medications        rOPINIRole 0.5 MG tablet  Commonly known as:  REQUIP  Take 1.5 mg by mouth at bedtime.        Allergies:  Allergies  Allergen Reactions  . Sulfa Antibiotics Rash    Family History: Family History  Problem Relation Age of Onset  . Kidney cancer Father   . Bladder Cancer Neg Hx     Social History:  reports that she quit smoking about 4 years ago. She does not have any smokeless tobacco history on file. She reports that she does not drink alcohol or use illicit drugs.  Physical Exam: BP 160/77 mmHg  Pulse 63  Temp(Src) 98.4 F (36.9 C) (Oral)  Resp 16  SpO2 99%  Constitutional:  Alert  and oriented, No acute distress. HEENT: [place] AT, moist mucus membranes.  Trachea midline, no masses. Cardiovascular: No clubbing, cyanosis, or edema. RRR.  Respiratory: Normal respiratory effort, no increased work of breathing. CTAB. GI: Abdomen is soft, nontender, nondistended, no abdominal masses GU: No CVA tenderness.  Skin: No rashes, bruises or suspicious lesions. Neurologic: Grossly intact, no focal deficits, moving all 4 extremities. Psychiatric: Normal mood and affect.   Laboratory Data: Lab Results  Component Value Date   WBC 4.9 01/15/2015   HGB 12.5 01/15/2015   HCT 38.2 01/15/2015   MCV 88.4 01/15/2015   PLT 173 01/15/2015    Lab Results  Component Value Date   CREATININE 0.72 09/02/2015    Urinalysis UCx 09/04/15 Comprehensive Final report   Result 1 Comment   Comments: No growth in 36 - 48 hours.         Pertinent Imaging: CLINICAL DATA: Microscopic hematuria, history LEFT nephroureterolithiasis post multiple prior procedures including multiple lithotripsies, ureteral stenting and stone extraction, ureteral stone fragment residual  EXAM: CT ABDOMEN AND PELVIS WITHOUT AND WITH CONTRAST  TECHNIQUE: Multidetector CT imaging of the abdomen and pelvis was performed following the standard protocol before and following the bolus administration of intravenous contrast. Sagittal and coronal MPR images reconstructed from axial data set.  CONTRAST: 125mL OMNIPAQUE IOHEXOL 350 MG/ML SOLN IV. Oral contrast not administered.  COMPARISON: 12/25/2014  FINDINGS: Lung bases clear.  6 mm diameter x 10 mm length proximal LEFT ureteral calculus with mild LEFT hydronephrosis.  Low-attenuation subcapsular collection identified at lateral LEFT kidney 7.0 x 2.7 x 6.5 cm question sequela of prior subcapsular hematoma.  No renal mass, additional urinary tract calcification, ureteral dilatation, RIGHT hydronephrosis.  Unremarkable bladder and  ureters.  5 mm low-attenuation lesion lateral segment LEFT lobe liver stable image 9.  Liver, gallbladder, spleen, pancreas, and adrenal glands otherwise normal.  Prior gastric bypass surgery.  Stomach and bowel loops otherwise normal appearance.  Normal appendix.  Uterus surgically absent with nonvisualization of ovaries.  No mass, adenopathy, free air, free fluid, or inflammatory process.  No hernia or acute bone lesion.  IMPRESSION: 6 mm proximal LEFT ureteral calculus with mild LEFT hydronephrosis.  No additional urinary tract calcifications.  Subcapsular low-attenuation collection lateral aspect of LEFT kidney question sequela of prior subcapsular hematoma.  Flattening of the lateral margin of the LEFT renal parenchyma/cortex without discrete renal mass.  No additional urinary tract calcifications.   Electronically Signed  By: Ulyses SouthwardMark Boles M.D.  On: 08/21/2015 10:17   Assessment & Plan:   As above  Vanna ScotlandAshley Avelina Mcclurkin, MD  [facility] [address], [address] [place], [place] [address] [phone]

## 2015-09-10 NOTE — Anesthesia Preprocedure Evaluation (Signed)
Anesthesia Evaluation  Patient identified by MRN, date of birth, ID band Patient awake    Reviewed: Allergy & Precautions, H&P , NPO status , Patient's Chart, lab work & pertinent test results  History of Anesthesia Complications Negative for: history of anesthetic complications  Airway Mallampati: III  TM Distance: <3 FB Neck ROM: limited    Dental  (+) Poor Dentition, Chipped, Missing   Pulmonary neg shortness of breath, sleep apnea , Recent URI , Residual Cough, former smoker,    Pulmonary exam normal breath sounds clear to auscultation       Cardiovascular Exercise Tolerance: Good hypertension, (-) angina(-) Past MI and (-) DOE Normal cardiovascular exam Rhythm:regular Rate:Normal     Neuro/Psych negative neurological ROS  negative psych ROS   GI/Hepatic negative GI ROS, Neg liver ROS,   Endo/Other  diabetes  Renal/GU Renal disease  negative genitourinary   Musculoskeletal   Abdominal   Peds  Hematology negative hematology ROS (+)   Anesthesia Other Findings Past Medical History:   RLS (restless legs syndrome)                                 Hypertension                                                 Chronic kidney disease                                         Comment:stones,hydronephrosis   Edema extremities                                            Diabetes mellitus without complication (HCC)                   Comment:Resolved since bariatric surgery   Sleep apnea                                                  Obesity                                                      Kidney stone                                                 Edema                                                        Diabetes (HCC)  Hydronephrosis                                               Calculus of kidney                              01/25/2013    Hematuria, microscopic                           01/12/2013   Past Surgical History:   ABDOMINAL HYSTERECTOMY                                        BACK SURGERY                                                  BARIATRIC SURGERY                                             OOPHORECTOMY                                                  salpingectomy                                                 URETEROSCOPY WITH HOLMIUM LASER LITHOTRIPSY     Left 01/20/2015      Comment:Procedure: URETEROSCOPY WITH HOLMIUM LASER               LITHOTRIPSY;  Surgeon: Lorraine Laxichard D Hart, MD;                Location: [facility];  Service: Urology;                Laterality: Left;   CYSTOSCOPY W/ RETROGRADES                       Left 01/20/2015      Comment:Procedure: CYSTOSCOPY WITH RETROGRADE               PYELOGRAM;  Surgeon: Lorraine Laxichard D Hart, MD;                Location: [facility];  Service: Urology;                Laterality: Left;   CYSTOSCOPY WITH STENT PLACEMENT                 N/A 01/20/2015      Comment:Procedure: CYSTOSCOPY WITH STENT PLACEMENT;                Surgeon: Lorraine Laxichard D Hart, MD;  [facility];  Service: Urology;  Laterality: N/A;   EXTRACORPOREAL SHOCK WAVE LITHOTRIPSY           Left 04/24/2015      Comment:Procedure: EXTRACORPOREAL SHOCK WAVE               LITHOTRIPSY (ESWL);  Surgeon: Lorraine Lax,               MD;  Location: [facility] OR[facility];  Service: Urology;                Laterality: Left;   EXTRACORPOREAL SHOCK WAVE LITHOTRIPSY           Right 03/13/2015      Comment:Procedure: EXTRACORPOREAL SHOCK WAVE               LITHOTRIPSY (ESWL);  Surgeon: Vanna Scotland,               MD;  Location: [facility];  Service: Urology;                Laterality: Right;   EXTRACORPOREAL SHOCK WAVE LITHOTRIPSY           Left 07/03/2015      Comment:Procedure: EXTRACORPOREAL SHOCK WAVE               LITHOTRIPSY (ESWL);  Surgeon: Lorraine Lax,               MD;  Location: [facility];  Service: Urology;                 Laterality: Left;   EXTRACORPOREAL SHOCK WAVE LITHOTRIPSY           Left 08/21/2015     Comment:Procedure: EXTRACORPOREAL SHOCK WAVE               LITHOTRIPSY (ESWL);  Surgeon: Vanna Scotland,               MD;  Location: [facility];  Service: Urology;                Laterality: Left;     Reproductive/Obstetrics negative OB ROS                             Anesthesia Physical Anesthesia Plan  ASA: III  Anesthesia Plan: General ETT   Post-op Pain Management:    Induction:   Airway Management Planned:   Additional Equipment:   Intra-op Plan:   Post-operative Plan:   Informed Consent: I have reviewed the patients History and Physical, chart, labs and discussed the procedure including the risks, benefits and alternatives for the proposed anesthesia with the patient or authorized representative who has indicated his/her understanding and acceptance.   Dental Advisory Given  Plan Discussed with: Anesthesiologist, CRNA and Surgeon  Anesthesia Plan Comments:         Anesthesia Quick Evaluation

## 2015-09-10 NOTE — Anesthesia Procedure Notes (Signed)
Procedure Name: Intubation Date/Time: 09/10/2015 11:11 AM Performed by: Lily KocherPERALTA, Laurie Dufrane Pre-anesthesia Checklist: Patient identified, Patient being monitored, Timeout performed, Emergency Drugs available and Suction available Patient Re-evaluated:Patient Re-evaluated prior to inductionOxygen Delivery Method: Circle system utilized Preoxygenation: Pre-oxygenation with 100% oxygen Intubation Type: IV induction Ventilation: Mask ventilation without difficulty Laryngoscope Size: Mac and 3 Grade View: Grade III Tube type: Oral Tube size: 7.0 mm Number of attempts: 1 Airway Equipment and Method: Stylet Placement Confirmation: ETT inserted through vocal cords under direct vision,  positive ETCO2 and breath sounds checked- equal and bilateral Secured at: 21 cm Tube secured with: Tape Dental Injury: Teeth and Oropharynx as per pre-operative assessment

## 2015-09-11 ENCOUNTER — Other Ambulatory Visit: Payer: Self-pay | Admitting: Internal Medicine

## 2015-09-11 DIAGNOSIS — Z1231 Encounter for screening mammogram for malignant neoplasm of breast: Secondary | ICD-10-CM

## 2015-09-19 ENCOUNTER — Ambulatory Visit
Admission: RE | Admit: 2015-09-19 | Discharge: 2015-09-19 | Disposition: A | Discharge status: Home / Self Care | Payer: 59 | Source: Ambulatory Visit | Attending: Internal Medicine | Admitting: Internal Medicine

## 2015-09-19 DIAGNOSIS — Z1231 Encounter for screening mammogram for malignant neoplasm of breast: Secondary | ICD-10-CM | POA: Diagnosis not present

## 2015-12-08 ENCOUNTER — Ambulatory Visit
Admission: RE | Admit: 2015-12-08 | Discharge: 2015-12-08 | Disposition: A | Discharge status: Home / Self Care | Payer: 59 | Source: Ambulatory Visit | Attending: Urology | Admitting: Urology

## 2015-12-08 DIAGNOSIS — Z87442 Personal history of urinary calculi: Secondary | ICD-10-CM | POA: Diagnosis not present

## 2015-12-08 DIAGNOSIS — N200 Calculus of kidney: Secondary | ICD-10-CM | POA: Diagnosis present

## 2015-12-08 DIAGNOSIS — Z09 Encounter for follow-up examination after completed treatment for conditions other than malignant neoplasm: Secondary | ICD-10-CM | POA: Insufficient documentation

## 2015-12-19 ENCOUNTER — Encounter: Payer: Self-pay | Admitting: Urology

## 2015-12-19 ENCOUNTER — Ambulatory Visit (INDEPENDENT_AMBULATORY_CARE_PROVIDER_SITE_OTHER): Payer: 59 | Admitting: Urology

## 2015-12-19 VITALS — BP 152/76 | HR 65 | Ht 66.0 in | Wt 227.0 lb

## 2015-12-19 DIAGNOSIS — S37092D Other injury of left kidney, subsequent encounter: Secondary | ICD-10-CM

## 2015-12-19 DIAGNOSIS — N201 Calculus of ureter: Secondary | ICD-10-CM | POA: Diagnosis not present

## 2015-12-19 DIAGNOSIS — Z87442 Personal history of urinary calculi: Secondary | ICD-10-CM | POA: Diagnosis not present

## 2015-12-19 NOTE — Progress Notes (Signed)
12/19/2015 5:07 PM   Laurie Dickson February 02, 1951 [id number]  Referring provider: Lauro Regulus, MD [address] [facility] [place], [place] [address]  Chief Complaint  Patient presents with  . Results    41month u/s    HPI:  65 year old female with history of nephrolithiasis whose had multiple ESWL procedures on the left and was also treated ureteroscopically for a left proximal ureteral stone, treated by perforation by Dr. Edwyna Shell. A follow-up CT scan did suggest that there was a 10 mm proximal residual intraluminal stone. She returned to the operating room on 09/10/2015 for further treatment of this possible stone at which time no stone was identified in the lumen was found to be patent. The stone was presumably intramural or outside of the ureter which is not consistent with the CT scan findings. There is no obvious obstruction.  He did also have a left perinephric hematoma which appears to have resolved today on renal ultrasound.  Today, she has no complaints. She is passed no stone since her last procedure. She has no flank pain, dysuria, urinary tract symptoms, or any other concerns.     PMH: Past Medical History  Diagnosis Date  . RLS (restless legs syndrome)   . Hypertension   . Chronic kidney disease     stones,hydronephrosis  . Edema extremities   . Diabetes mellitus without complication (HCC)     Resolved since bariatric surgery  . Sleep apnea   . Obesity   . Kidney stone   . Edema   . Diabetes (HCC)   . Hydronephrosis   . Calculus of kidney 01/25/2013  . Hematuria, microscopic 01/12/2013  . Difficult intubation     Surgical History: Past Surgical History  Procedure Laterality Date  . Abdominal hysterectomy    . Back surgery    . Bariatric surgery    . Oophorectomy    . Salpingectomy    . Ureteroscopy with holmium laser lithotripsy Left 01/20/2015    Procedure: URETEROSCOPY WITH HOLMIUM LASER LITHOTRIPSY;  Surgeon: Lorraine Lax, MD;   Location: [facility];  Service: Urology;  Laterality: Left;  . Cystoscopy w/ retrogrades Left 01/20/2015    Procedure: CYSTOSCOPY WITH RETROGRADE PYELOGRAM;  Surgeon: Lorraine Lax, MD;  Location: [facility];  Service: Urology;  Laterality: Left;  . Cystoscopy with stent placement N/A 01/20/2015    Procedure: CYSTOSCOPY WITH STENT PLACEMENT;  Surgeon: Lorraine Lax, MD;  Location: [facility];  Service: Urology;  Laterality: N/A;  . Extracorporeal shock wave lithotripsy Left 04/24/2015    Procedure: EXTRACORPOREAL SHOCK WAVE LITHOTRIPSY (ESWL);  Surgeon: Lorraine Lax, MD;  Location: [facility];  Service: Urology;  Laterality: Left;  . Extracorporeal shock wave lithotripsy Right 03/13/2015    Procedure: EXTRACORPOREAL SHOCK WAVE LITHOTRIPSY (ESWL);  Surgeon: Vanna Scotland, MD;  Location: [facility];  Service: Urology;  Laterality: Right;  . Extracorporeal shock wave lithotripsy Left 07/03/2015    Procedure: EXTRACORPOREAL SHOCK WAVE LITHOTRIPSY (ESWL);  Surgeon: Lorraine Lax, MD;  Location: [facility];  Service: Urology;  Laterality: Left;  . Extracorporeal shock wave lithotripsy Left 08/21/2015    Procedure: EXTRACORPOREAL SHOCK WAVE LITHOTRIPSY (ESWL);  Surgeon: Vanna Scotland, MD;  Location: [facility];  Service: Urology;  Laterality: Left;  . Cystoscopy/ureteroscopy/holmium laser/stent placement Left 09/10/2015    Procedure: CYSTOSCOPY/URETEROSCOPY//STENT PLACEMENT/retrograde pyelogram/stone basketing;  Surgeon: Vanna Scotland, MD;  Location: [facility];  Service: Urology;  Laterality: Left;    Home Medications:    Medication List  This list is accurate as of: 12/19/15  5:07 PM.  Always use your most recent med list.               rOPINIRole 0.5 MG tablet  Commonly known as:  REQUIP  Take 1.5 mg by mouth at bedtime.        Allergies:  Allergies  Allergen Reactions  . Sulfa Antibiotics Rash    Family History: Family History  Problem Relation Age of Onset  . Kidney cancer Father   .  Bladder Cancer Neg Hx   . Breast cancer Neg Hx     Social History:  reports that she quit smoking about 4 years ago. She does not have any smokeless tobacco history on file. She reports that she does not drink alcohol or use illicit drugs.  ROS: UROLOGY Frequent Urination?: No Hard to postpone urination?: No Burning/pain with urination?: No Get up at night to urinate?: No Leakage of urine?: No Urine stream starts and stops?: No Trouble starting stream?: No Do you have to strain to urinate?: No Blood in urine?: No Urinary tract infection?: No Sexually transmitted disease?: No Injury to kidneys or bladder?: No Painful intercourse?: No Weak stream?: No Currently pregnant?: No Vaginal bleeding?: No Last menstrual period?: n  Gastrointestinal Nausea?: No Vomiting?: No Indigestion/heartburn?: No Diarrhea?: No Constipation?: No  Constitutional Fever: No Night sweats?: No Weight loss?: No Fatigue?: No  Skin Skin rash/lesions?: No Itching?: No  Eyes Blurred vision?: No Double vision?: No  Ears/Nose/Throat Sore throat?: No Sinus problems?: No  Hematologic/Lymphatic Swollen glands?: No Easy bruising?: No  Cardiovascular Leg swelling?: No Chest pain?: No  Respiratory Cough?: No Shortness of breath?: No  Endocrine Excessive thirst?: No  Musculoskeletal Back pain?: No Joint pain?: No  Neurological Headaches?: No Dizziness?: No  Psychologic Depression?: No Anxiety?: No  Physical Exam: BP 152/76 mmHg  Pulse 65  Ht  (1.676 m)  Wt 227 lb (102.967 kg)  BMI 36.66 kg/m2  Constitutional:  Alert and oriented, No acute distress. HEENT: [place] AT, moist mucus membranes.  Trachea midline, no masses. Cardiovascular: No clubbing, cyanosis, or edema. Respiratory: Normal respiratory effort, no increased work of breathing. GI: Abdomen is soft, nontender, nondistended, no abdominal masses GU: No CVA tenderness.  Skin: No rashes, bruises or suspicious  lesions. Neurologic: Grossly intact, no focal deficits, moving all 4 extremities. Psychiatric: Normal mood and affect.  Laboratory Data: Lab Results  Component Value Date   WBC 4.9 01/15/2015   HGB 12.5 01/15/2015   HCT 38.2 01/15/2015   MCV 88.4 01/15/2015   PLT 173 01/15/2015    Lab Results  Component Value Date   CREATININE 0.72 09/02/2015    Pertinent Imaging: PACS Images     Show images for US Renal     Study Result     CLINICAL DATA: Renal calculus.  EXAM: RENAL / URINARY TRACT ULTRASOUND COMPLETE  COMPARISON: CT abdomen pelvis 08/21/2015  FINDINGS: Right Kidney:  Length: 10.3 cm. Echogenicity within normal limits. No mass or hydronephrosis visualized.  Left Kidney:  Length: 10.7 cm. Negative for renal obstruction or mass. Left perinephric fluid collection has resolved since the prior CT.  Bladder:  Normal bladder. Bilateral ureteral jets visualized.  IMPRESSION: Negative renal ultrasound. Left perinephric fluid collection has resolved since prior study. No renal obstruction.   Electronically Signed  By: Marlan Palau M.D.  On: 12/08/2015 11:43    Renal ultrasound was personally reviewed today and with the patient  Assessment & Plan:  1. History of kidney stones Reviewed importance of good PO fluid intake No significant stones on RUS - US Renal; Future  2. Left ureteral calculus Extraluminal without obstruction  3. Traumatic perinephric hematoma, left, subsequent encounter Resolved Normal renal function   Return in about 1 year (around 12/18/2016) for f/u RUS.  Vanna ScotlandAshley Eulas Schweitzer, MD  [facility] [address], [address] [place], [place] [address] [phone]

## 2016-07-26 NOTE — Telephone Encounter (Signed)
No message left. CD

## 2016-12-21 ENCOUNTER — Ambulatory Visit
Admission: RE | Admit: 2016-12-21 | Discharge: 2016-12-21 | Disposition: A | Discharge status: Home / Self Care | Payer: 59 | Source: Ambulatory Visit | Attending: Urology | Admitting: Urology

## 2016-12-21 DIAGNOSIS — N261 Atrophy of kidney (terminal): Secondary | ICD-10-CM | POA: Insufficient documentation

## 2016-12-21 DIAGNOSIS — N1330 Unspecified hydronephrosis: Secondary | ICD-10-CM | POA: Insufficient documentation

## 2016-12-21 DIAGNOSIS — Z87442 Personal history of urinary calculi: Secondary | ICD-10-CM | POA: Diagnosis present

## 2016-12-21 DIAGNOSIS — N200 Calculus of kidney: Secondary | ICD-10-CM | POA: Diagnosis not present

## 2016-12-24 ENCOUNTER — Ambulatory Visit (INDEPENDENT_AMBULATORY_CARE_PROVIDER_SITE_OTHER): Payer: 59 | Admitting: Urology

## 2016-12-24 ENCOUNTER — Encounter: Payer: Self-pay | Admitting: Urology

## 2016-12-24 VITALS — BP 189/102 | Ht 66.0 in | Wt 210.0 lb

## 2016-12-24 DIAGNOSIS — N200 Calculus of kidney: Secondary | ICD-10-CM | POA: Diagnosis not present

## 2016-12-24 DIAGNOSIS — N201 Calculus of ureter: Secondary | ICD-10-CM

## 2016-12-24 DIAGNOSIS — N1330 Unspecified hydronephrosis: Secondary | ICD-10-CM | POA: Diagnosis not present

## 2016-12-24 NOTE — Progress Notes (Signed)
12/24/2016 10:55 AM   Laurie Dickson 01/15/1951 [id number]  Referring provider: Lauro Regulus, MD [address] [facility] [place], [place] [address]    HPI:  66 year old female with history of nephrolithiasis who returned today for routine annual follow-up. Over the past year, she reports that she is been doing very well. She denies any flank pain, gross hematuria, or any other significant urinary symptoms. She is passed no stones in the interim.  Follow-up renal ultrasound does today, however show a new left nonobstructing renal calculus (13 mm) along with new mild to moderate left-sided hydronephrosis.  She has had multiple ESWL procedures on the left and was also treated ureteroscopically for a left proximal ureteral stone complicated by perforation by Dr. Edwyna Shell. A follow-up CT scan did suggest that there was a 10 mm proximal residual intraluminal stone. She returned to the operating room on 09/10/2015 for further treatment of this possible stone at which time no stone was identified in the lumen was found to be patent. The stone was presumably intramural or outside of the ureter which is not consistent with the CT scan findings. There is no obvious obstruction.  She did also have a left perinephric hematoma which resolved on follow up ultrasounds as of 11/2015.   PMH: Past Medical History:  Diagnosis Date  . Calculus of kidney 01/25/2013  . Chronic kidney disease    stones,hydronephrosis  . Diabetes (HCC)   . Diabetes mellitus without complication (HCC)    Resolved since bariatric surgery  . Difficult intubation   . Edema   . Edema extremities   . Hematuria, microscopic 01/12/2013  . Hydronephrosis   . Hypertension   . Kidney stone   . Obesity   . RLS (restless legs syndrome)   . Sleep apnea     Surgical History: Past Surgical History:  Procedure Laterality Date  . ABDOMINAL HYSTERECTOMY    . BACK SURGERY    . BARIATRIC SURGERY    .  CYSTOSCOPY W/ RETROGRADES Left 01/20/2015   Procedure: CYSTOSCOPY WITH RETROGRADE PYELOGRAM;  Surgeon: Lorraine Lax, MD;  Location: [facility];  Service: Urology;  Laterality: Left;  . CYSTOSCOPY WITH STENT PLACEMENT N/A 01/20/2015   Procedure: CYSTOSCOPY WITH STENT PLACEMENT;  Surgeon: Lorraine Lax, MD;  Location: [facility];  Service: Urology;  Laterality: N/A;  . CYSTOSCOPY/URETEROSCOPY/HOLMIUM LASER/STENT PLACEMENT Left 09/10/2015   Procedure: CYSTOSCOPY/URETEROSCOPY//STENT PLACEMENT/retrograde pyelogram/stone basketing;  Surgeon: Vanna Scotland, MD;  Location: [facility];  Service: Urology;  Laterality: Left;  . EXTRACORPOREAL SHOCK WAVE LITHOTRIPSY Left 04/24/2015   Procedure: EXTRACORPOREAL SHOCK WAVE LITHOTRIPSY (ESWL);  Surgeon: Lorraine Lax, MD;  Location: [facility];  Service: Urology;  Laterality: Left;  . EXTRACORPOREAL SHOCK WAVE LITHOTRIPSY Right 03/13/2015   Procedure: EXTRACORPOREAL SHOCK WAVE LITHOTRIPSY (ESWL);  Surgeon: Vanna Scotland, MD;  Location: [facility];  Service: Urology;  Laterality: Right;  . EXTRACORPOREAL SHOCK WAVE LITHOTRIPSY Left 07/03/2015   Procedure: EXTRACORPOREAL SHOCK WAVE LITHOTRIPSY (ESWL);  Surgeon: Lorraine Lax, MD;  Location: [facility];  Service: Urology;  Laterality: Left;  . EXTRACORPOREAL SHOCK WAVE LITHOTRIPSY Left 08/21/2015   Procedure: EXTRACORPOREAL SHOCK WAVE LITHOTRIPSY (ESWL);  Surgeon: Vanna Scotland, MD;  Location: [facility];  Service: Urology;  Laterality: Left;  . OOPHORECTOMY    . salpingectomy    . URETEROSCOPY WITH HOLMIUM LASER LITHOTRIPSY Left 01/20/2015   Procedure: URETEROSCOPY WITH HOLMIUM LASER LITHOTRIPSY;  Surgeon: Lorraine Lax, MD;  Location: [facility];  Service: Urology;  Laterality: Left;    Home Medications:  Allergies as of 12/24/2016      Reactions   Sulfa Antibiotics Rash      Medication List       Accurate as of 12/24/16 10:55 AM. Always use your most recent med list.          rOPINIRole 0.5 MG tablet Commonly  known as:  REQUIP Take 1.5 mg by mouth at bedtime.       Allergies:  Allergies  Allergen Reactions  . Sulfa Antibiotics Rash    Family History: Family History  Problem Relation Age of Onset  . Kidney cancer Father   . Bladder Cancer Neg Hx   . Breast cancer Neg Hx     Social History:  reports that she quit smoking about 5 years ago. She has never used smokeless tobacco. She reports that she does not drink alcohol or use drugs.  ROS: UROLOGY Frequent Urination?: No Hard to postpone urination?: No Burning/pain with urination?: No Get up at night to urinate?: No Leakage of urine?: No Urine stream starts and stops?: No Trouble starting stream?: No Do you have to strain to urinate?: No Blood in urine?: No Urinary tract infection?: No Sexually transmitted disease?: No Injury to kidneys or bladder?: No Painful intercourse?: No Weak stream?: No Currently pregnant?: No Vaginal bleeding?: No Last menstrual period?: n  Gastrointestinal Nausea?: No Vomiting?: No Indigestion/heartburn?: No Diarrhea?: No Constipation?: No  Constitutional Fever: No Night sweats?: No Weight loss?: No Fatigue?: No  Skin Skin rash/lesions?: No Itching?: No  Eyes Blurred vision?: No Double vision?: No  Ears/Nose/Throat Sore throat?: No Sinus problems?: No  Hematologic/Lymphatic Swollen glands?: No Easy bruising?: No  Cardiovascular Leg swelling?: No Chest pain?: No  Respiratory Cough?: No Shortness of breath?: No  Endocrine Excessive thirst?: No  Musculoskeletal Back pain?: No Joint pain?: No  Neurological Headaches?: No Dizziness?: No  Psychologic Depression?: No Anxiety?: No  Physical Exam: BP (!) 189/102 (BP Location: Left Arm, Patient Position: Sitting, Cuff Size: Large)   Ht  (1.676 m)   Wt 210 lb (95.3 kg)   BMI 33.89 kg/m   Constitutional:  Alert and oriented, No acute distress. HEENT: [place] AT, moist mucus membranes.  Trachea midline, no  masses. Cardiovascular: No clubbing, cyanosis, or edema. Respiratory: Normal respiratory effort, no increased work of breathing. GI: Abdomen is soft, nontender, nondistended, no abdominal masses GU: No CVA tenderness.  Skin: No rashes, bruises or suspicious lesions. Neurologic: Grossly intact, no focal deficits, moving all 4 extremities. Psychiatric: Normal mood and affect.  Laboratory Data: Lab Results  Component Value Date   WBC 4.9 01/15/2015   HGB 12.5 01/15/2015   HCT 38.2 01/15/2015   MCV 88.4 01/15/2015   PLT 173 01/15/2015    Lab Results  Component Value Date   CREATININE 0.72 09/02/2015    Pertinent Imaging: CLINICAL DATA:  Chronic kidney disease.  History of nephrolithiasis.  EXAM: RENAL / URINARY TRACT ULTRASOUND COMPLETE  COMPARISON:  Renal ultrasound dated 12/08/2015. Abdomen and pelvis CT dated 08/21/2015.  FINDINGS: Right Kidney:  Length: 9.7 cm. Diffuse cortical thinning and prominent renal sinus fat with mild progression. Echogenicity within normal limits. No mass or hydronephrosis visualized.  Left Kidney:  Length: 11.9 cm. Echogenicity within normal limits. 13 mm mid left renal calculus. Interval mild to moderate dilatation of the renal collecting system, unchanged after voiding. No visible mass.  Bladder:  Appears normal for degree of bladder distention. Bilateral ureteral jets are noted.  IMPRESSION: 1. Interval mild to moderate left hydronephrosis. 2. 13 mm nonobstructing mid left renal calculus. 3. Mild to moderate diffuse right renal cortical atrophy with mild progression.   Electronically Signed   By: Beckie Salts M.D.   On: 12/21/2016 16:33  Renal ultrasound was personally reviewed today and with the patient  Assessment & Plan:    1. Calculus of kidney New 13 mm left nonobstructing calculus Will assess size and location better on CT stone protocol is below - Urinalysis, Complete - CT RENAL STONE STUDY;  Future  2. Hydronephrosis of left kidney New asymptomatic left mild to moderate hydronephrosis of unclear etiology May represent obstruction by known intramural ureteral stone versus stricture versus obstructing ureteral calculus We will start with CT stone protocol scan for further assessment- will call with results and evaluation  3. Left ureteral calculus History of ureteral perforation and radiographic ureteral calcification which is likely in the wall of the ureter based on previous ureteroscopic findings   Return for will call with CT results and formulate plan.  Vanna Scotland, MD  [facility] [address] [place], [place] [address] [phone]  \\

## 2016-12-27 NOTE — Addendum Note (Signed)
Addended by: Honor Loh on: 12/27/2016 02:01 PM   Modules accepted: Orders

## 2017-01-04 ENCOUNTER — Ambulatory Visit
Admission: RE | Admit: 2017-01-04 | Discharge: 2017-01-04 | Disposition: A | Discharge status: Home / Self Care | Payer: 59 | Source: Ambulatory Visit | Attending: Urology | Admitting: Urology

## 2017-01-04 DIAGNOSIS — M4316 Spondylolisthesis, lumbar region: Secondary | ICD-10-CM | POA: Diagnosis not present

## 2017-01-04 DIAGNOSIS — I700 Atherosclerosis of aorta: Secondary | ICD-10-CM | POA: Insufficient documentation

## 2017-01-04 DIAGNOSIS — N132 Hydronephrosis with renal and ureteral calculous obstruction: Secondary | ICD-10-CM | POA: Insufficient documentation

## 2017-01-04 DIAGNOSIS — K8020 Calculus of gallbladder without cholecystitis without obstruction: Secondary | ICD-10-CM | POA: Diagnosis not present

## 2017-01-04 DIAGNOSIS — M47816 Spondylosis without myelopathy or radiculopathy, lumbar region: Secondary | ICD-10-CM | POA: Diagnosis not present

## 2017-01-04 DIAGNOSIS — N200 Calculus of kidney: Secondary | ICD-10-CM | POA: Diagnosis present

## 2017-01-06 ENCOUNTER — Telehealth: Payer: Self-pay | Admitting: Urology

## 2017-01-06 NOTE — Telephone Encounter (Signed)
Spoke with the patient, discussed that the intramural stone in the left ureter is unchanged and CT scan shows no overt hydronephrosis or obstruction from this which is reassuring. As such, no intervention is needed at this time.  She also has an 8 mm right lower pole nonobstructing stone and is currently asymptomatic. As such, we will continue to follow this.  I would like to see her back in 6 months with a KUB. She is agreeable with this plan.  She was advised to return sooner if she develops flank pain, hematuria, or any other worrisome symptoms.  Vedansh Kerstetter, MD  

## 2017-01-06 NOTE — Telephone Encounter (Signed)
LMOM to discuss CT scan.  Advised to call back tomorrow when I'm in the office.   Vanna ScotlandAshley Quadry Kampa, MD

## 2017-01-06 NOTE — Telephone Encounter (Signed)
done

## 2017-02-10 DIAGNOSIS — Z5321 Procedure and treatment not carried out due to patient leaving prior to being seen by health care provider: Secondary | ICD-10-CM | POA: Diagnosis not present

## 2017-02-10 DIAGNOSIS — R1011 Right upper quadrant pain: Secondary | ICD-10-CM | POA: Diagnosis present

## 2017-02-10 NOTE — ED Triage Notes (Signed)
Pt states that she has a history of kidney stones and that she had a CT scan 3 weeks ago and they saw one on her R side.  Pt states that she has R sided flank pain at this time.  Pt denies frequency or pain with urination.  Pt A&Ox4, in NAD and ambulatory to triage.

## 2017-02-11 ENCOUNTER — Emergency Department
Admission: EM | Admit: 2017-02-11 | Discharge: 2017-02-11 | Disposition: A | Discharge status: Home / Self Care | Payer: 59 | Attending: Emergency Medicine | Admitting: Emergency Medicine

## 2017-02-11 ENCOUNTER — Ambulatory Visit (INDEPENDENT_AMBULATORY_CARE_PROVIDER_SITE_OTHER): Payer: 59 | Admitting: *Deleted

## 2017-02-11 ENCOUNTER — Ambulatory Visit
Admission: RE | Admit: 2017-02-11 | Discharge: 2017-02-11 | Disposition: A | Discharge status: Home / Self Care | Payer: 59 | Source: Ambulatory Visit | Attending: Urology | Admitting: Urology

## 2017-02-11 ENCOUNTER — Telehealth: Payer: Self-pay | Admitting: Urology

## 2017-02-11 VITALS — BP 186/95 | HR 69 | Wt 239.8 lb

## 2017-02-11 DIAGNOSIS — R109 Unspecified abdominal pain: Secondary | ICD-10-CM | POA: Diagnosis not present

## 2017-02-11 DIAGNOSIS — N201 Calculus of ureter: Secondary | ICD-10-CM

## 2017-02-11 LAB — URINALYSIS, COMPLETE (UACMP) WITH MICROSCOPIC
Bilirubin Urine: NEGATIVE
GLUCOSE, UA: NEGATIVE mg/dL
Ketones, ur: NEGATIVE mg/dL
NITRITE: NEGATIVE
PH: 5 (ref 5.0–8.0)
Protein, ur: NEGATIVE mg/dL
SPECIFIC GRAVITY, URINE: 1.021 (ref 1.005–1.030)

## 2017-02-11 LAB — URINALYSIS, COMPLETE
Bilirubin, UA: NEGATIVE
Glucose, UA: NEGATIVE
Ketones, UA: NEGATIVE
NITRITE UA: NEGATIVE
PH UA: 6 (ref 5.0–7.5)
PROTEIN UA: NEGATIVE
Specific Gravity, UA: 1.02 (ref 1.005–1.030)
Urobilinogen, Ur: 0.2 mg/dL (ref 0.2–1.0)

## 2017-02-11 LAB — BASIC METABOLIC PANEL
Anion gap: 5 (ref 5–15)
BUN: 25 mg/dL — ABNORMAL HIGH (ref 6–20)
CALCIUM: 8.6 mg/dL — AB (ref 8.9–10.3)
CO2: 27 mmol/L (ref 22–32)
Chloride: 108 mmol/L (ref 101–111)
Creatinine, Ser: 1.23 mg/dL — ABNORMAL HIGH (ref 0.44–1.00)
GFR calc Af Amer: 52 mL/min — ABNORMAL LOW (ref >60)
GFR calc non Af Amer: 45 mL/min — ABNORMAL LOW (ref >60)
GLUCOSE: 114 mg/dL — AB (ref 65–99)
Potassium: 4.6 mmol/L (ref 3.5–5.1)
Sodium: 140 mmol/L (ref 135–145)

## 2017-02-11 LAB — CBC
HEMATOCRIT: 39.1 % (ref 35.0–47.0)
Hemoglobin: 13 g/dL (ref 12.0–16.0)
MCH: 28.2 pg (ref 26.0–34.0)
MCHC: 33.3 g/dL (ref 32.0–36.0)
MCV: 84.7 fL (ref 80.0–100.0)
Platelets: 190 10*3/uL (ref 150–440)
RBC: 4.61 MIL/uL (ref 3.80–5.20)
RDW: 14.3 % (ref 11.5–14.5)
WBC: 5.2 10*3/uL (ref 3.6–11.0)

## 2017-02-11 LAB — MICROSCOPIC EXAMINATION: RBC, UA: >30 /hpf — ABNORMAL HIGH (ref 0–?)

## 2017-02-11 MED ORDER — OXYCODONE-ACETAMINOPHEN 5-325 MG PO TABS: 1.0000 | ORAL_TABLET | ORAL | 0 refills | Status: DC | PRN

## 2017-02-11 MED ORDER — TAMSULOSIN HCL 0.4 MG PO CAPS: 0.4000 mg | ORAL_CAPSULE | Freq: Every day | ORAL | 0 refills | Status: DC

## 2017-02-11 NOTE — Progress Notes (Signed)
Patient just here to give urine sample. Patient complaining of flank pain and nausea.Per Dr. Apolinar JunesBrandon urine does not look infected and patient can have a KUB today to check on stones and can work her in on Monday if pain persist over weekend. Patient notified and ok with the plan.  In and Out Catheterization  Patient is present today for a I & O catheterization due to flank pain. Patient was cleaned and prepped in a sterile fashion with betadine and Lidocaine 2% jelly was instilled into the urethra.  A 14FR cath was inserted no complications were noted , 55ml of urine return was noted, urine was yellow in color. A clean urine sample was collected for clean catch urinalysis. Bladder was drained  and catheter was removed with out difficulty.    Preformed by: Dallas Schimkeamona Berlie Hatchel CMA

## 2017-02-11 NOTE — Telephone Encounter (Signed)
66 year old female who developed acute onset right flank pain yesterday. She tried to go to the emergency room but the weight was very long and she left. She Apolinar JunesBrandon were office earlier today for nurse visit to rule out UTI. Her urine was unremarkable other than for blood. She was sent for a KUB which shows a 10 x 5 mm right mid ureteral stone.  She denies any fevers or chills. No dysuria.  I'll provide her with a prescription for pain medication and Flomax for over the weekend. I'll see her on Monday to discuss plans for surgical intervention given the size of the stone. If she develops severe poorly controlled pain, fevers, chills, or any other concerning symptoms, she understands that she should go to the emergency room immediately.  Vanna Scotland/Vina Byrd, MD

## 2017-02-14 ENCOUNTER — Encounter: Payer: Self-pay | Admitting: Urology

## 2017-02-14 ENCOUNTER — Ambulatory Visit (INDEPENDENT_AMBULATORY_CARE_PROVIDER_SITE_OTHER): Payer: 59 | Admitting: Urology

## 2017-02-14 VITALS — Ht 65.0 in | Wt 239.0 lb

## 2017-02-14 DIAGNOSIS — N200 Calculus of kidney: Secondary | ICD-10-CM | POA: Diagnosis not present

## 2017-02-14 DIAGNOSIS — R109 Unspecified abdominal pain: Secondary | ICD-10-CM | POA: Diagnosis not present

## 2017-02-14 DIAGNOSIS — R10A0 Flank pain, unspecified side: Secondary | ICD-10-CM

## 2017-02-14 NOTE — Progress Notes (Signed)
02/14/2017 4:37 PM   Laurie Dickson 06/19/1951 [id number]  Referring provider: Lauro RegulusAnderson, Marshall W, MD [address] [facility] [place], [place] [id number]    HPI:  66 year old female with history of nephrolithiasis who is recently seen for routine follow-up, had a CT scan on 01/05/2017 with bilateral nephrolithiasis and a nonobstructing left intramural ureteral calculus with history of known perforation at that site.  She subsequently presented to our office with right flank pain and urinary symptoms last week. KUB at that time demonstrated a 10 mm right ureteral calculus. She was given Flomax, pain medication, strict return symptoms and treated with medical expulsive therapy over the weekend.  She did spontaneously pass the stone on Saturday which she brings with her today. The size of the stone corresponds with the KUB findings. Her severe flank pain has resolved and she is no longer requiring narcotics. She does have some dull tenderness in her flank which she describes as soreness.  She has had multiple ESWL procedures on the left and was also treated ureteroscopically for a left proximal ureteral stone complicated by perforation by Dr. Edwyna ShellHart. A follow-up CT scan did suggest that there was a 10 mm proximal residual intraluminal stone. She returned to the operating room on 09/10/2015 for further treatment of this possible stone at which time no stone was identified in the lumen was found to be patent. The stone was presumably intramural or outside of the ureter which is not consistent with the CT scan findings. There is no obvious obstruction.  She did also have a history left perinephric hematoma which resolved on follow up ultrasounds as of 11/2015.  No previous stone analysis.   PMH: Past Medical History:  Diagnosis Date  . Calculus of kidney 01/25/2013  . Chronic kidney disease    stones,hydronephrosis  . Diabetes (HCC)   . Diabetes mellitus without complication  (HCC)    Resolved since bariatric surgery  . Difficult intubation   . Edema   . Edema extremities   . Hematuria, microscopic 01/12/2013  . Hydronephrosis   . Hypertension   . Kidney stone   . Obesity   . RLS (restless legs syndrome)   . Sleep apnea     Surgical History: Past Surgical History:  Procedure Laterality Date  . ABDOMINAL HYSTERECTOMY    . BACK SURGERY    . BARIATRIC SURGERY    . CYSTOSCOPY W/ RETROGRADES Left 01/20/2015   Procedure: CYSTOSCOPY WITH RETROGRADE PYELOGRAM;  Surgeon: Lorraine Laxichard D Hart, MD;  Location: [facility];  Service: Urology;  Laterality: Left;  . CYSTOSCOPY WITH STENT PLACEMENT N/A 01/20/2015   Procedure: CYSTOSCOPY WITH STENT PLACEMENT;  Surgeon: Lorraine Laxichard D Hart, MD;  Location: [facility];  Service: Urology;  Laterality: N/A;  . CYSTOSCOPY/URETEROSCOPY/HOLMIUM LASER/STENT PLACEMENT Left 09/10/2015   Procedure: CYSTOSCOPY/URETEROSCOPY//STENT PLACEMENT/retrograde pyelogram/stone basketing;  Surgeon: Vanna ScotlandAshley Keshana Klemz, MD;  Location: [facility];  Service: Urology;  Laterality: Left;  . EXTRACORPOREAL SHOCK WAVE LITHOTRIPSY Left 04/24/2015   Procedure: EXTRACORPOREAL SHOCK WAVE LITHOTRIPSY (ESWL);  Surgeon: Lorraine Laxichard D Hart, MD;  Location: [facility];  Service: Urology;  Laterality: Left;  . EXTRACORPOREAL SHOCK WAVE LITHOTRIPSY Right 03/13/2015   Procedure: EXTRACORPOREAL SHOCK WAVE LITHOTRIPSY (ESWL);  Surgeon: Vanna ScotlandAshley Sham Alviar, MD;  Location: [facility];  Service: Urology;  Laterality: Right;  . EXTRACORPOREAL SHOCK WAVE LITHOTRIPSY Left 07/03/2015   Procedure: EXTRACORPOREAL SHOCK WAVE LITHOTRIPSY (ESWL);  Surgeon: Lorraine Laxichard D Hart, MD;  Location: [facility];  Service: Urology;  Laterality: Left;  . EXTRACORPOREAL  SHOCK WAVE LITHOTRIPSY Left 08/21/2015   Procedure: EXTRACORPOREAL SHOCK WAVE LITHOTRIPSY (ESWL);  Surgeon: Vanna Scotland, MD;  Location: [facility];  Service: Urology;  Laterality: Left;  . OOPHORECTOMY    . salpingectomy    . URETEROSCOPY WITH HOLMIUM LASER LITHOTRIPSY  Left 01/20/2015   Procedure: URETEROSCOPY WITH HOLMIUM LASER LITHOTRIPSY;  Surgeon: Lorraine Lax, MD;  Location: [facility];  Service: Urology;  Laterality: Left;    Home Medications:  Allergies as of 02/14/2017      Reactions   Sulfa Antibiotics Rash      Medication List       Accurate as of 02/14/17  4:37 PM. Always use your most recent med list.          oxyCODONE-acetaminophen 5-325 MG tablet Commonly known as:  PERCOCET Take 1-2 tablets by mouth every 4 (four) hours as needed for moderate pain or severe pain.   rOPINIRole 0.5 MG tablet Commonly known as:  REQUIP Take 1.5 mg by mouth at bedtime.   tamsulosin 0.4 MG Caps capsule Commonly known as:  FLOMAX Take 1 capsule (0.4 mg total) by mouth daily.       Allergies:  Allergies  Allergen Reactions  . Sulfa Antibiotics Rash    Family History: Family History  Problem Relation Age of Onset  . Kidney cancer Father   . Bladder Cancer Neg Hx   . Breast cancer Neg Hx     Social History:  reports that she quit smoking about 6 years ago. She has never used smokeless tobacco. She reports that she does not drink alcohol or use drugs.  ROS: UROLOGY Frequent Urination?: No Hard to postpone urination?: No Burning/pain with urination?: No Get up at night to urinate?: No Leakage of urine?: No Urine stream starts and stops?: No Trouble starting stream?: No Do you have to strain to urinate?: No Blood in urine?: No Urinary tract infection?: No Sexually transmitted disease?: No Injury to kidneys or bladder?: No Painful intercourse?: No Weak stream?: No Currently pregnant?: No Vaginal bleeding?: No Last menstrual period?: N  Gastrointestinal Nausea?: No Vomiting?: No Indigestion/heartburn?: No Diarrhea?: No Constipation?: No  Constitutional Fever: No Night sweats?: No Weight loss?: No Fatigue?: No  Skin Skin rash/lesions?: No Itching?: No  Eyes Blurred vision?: No Double vision?:  No  Ears/Nose/Throat Sore throat?: No Sinus problems?: No  Hematologic/Lymphatic Swollen glands?: No Easy bruising?: No  Cardiovascular Leg swelling?: No Chest pain?: No  Respiratory Cough?: No Shortness of breath?: No  Endocrine Excessive thirst?: No  Musculoskeletal Back pain?: No Joint pain?: No  Neurological Headaches?: No Dizziness?: No  Psychologic Depression?: No Anxiety?: No  Physical Exam: Ht 5\' 5"  (1.651 m)   Wt 239 lb (108.4 kg)   BMI 39.77 kg/m   Constitutional:  Alert and oriented, No acute distress. HEENT: [place] AT, moist mucus membranes.  Trachea midline, no masses. Cardiovascular: No clubbing, cyanosis, or edema. Respiratory: Normal respiratory effort, no increased work of breathing. GI: Abdomen is soft, nontender, nondistended, no abdominal masses GU: No CVA tenderness.  Skin: No rashes, bruises or suspicious lesions. Neurologic: Grossly intact, no focal deficits, moving all 4 extremities. Psychiatric: Normal mood and affect.  Laboratory Data: Lab Results  Component Value Date   WBC 5.2 02/10/2017   HGB 13.0 02/10/2017   HCT 39.1 02/10/2017   MCV 84.7 02/10/2017   PLT 190 02/10/2017    Lab Results  Component Value Date   CREATININE 1.23 (H) 02/10/2017    Pertinent Imaging: CT abd/ pelvis  01/05/17 IMPRESSION: 1. There is an 8 mm calcification in the left proximal ureter along the lateral margin of the ureter which is unchanged in position from 08/21/2015. I am uncertain whether this is a calcification in the wall of the ureter or perhaps an a small ureteral diverticulum. It does not appear overtly obstructive although there is some very minimal fullness of the left collecting system. Other nonobstructive renal calculi are also present. No overt hydronephrosis. 2.  Aortic Atherosclerosis (ICD10-I70.0).  Mild cardiomegaly. 3. Sludge or gallstones in the gallbladder. 4. Likely small focus of adipose tissue inflammation or  small omental infarct in the right lower quadrant, likely chronic, but not visible on 08/21/2015. 5. Grade 1 degenerative anterolisthesis at L4-5 with possible central narrowing of the thecal sac at this level.   Electronically Signed   By: Gaylyn Rong M.D.   On: 01/05/2017 09:23  KUB 02/11/17 CLINICAL DATA:  Right flank pain  EXAM: ABDOMEN - 1 VIEW  COMPARISON:  CT abdomen pelvis 01/04/2017  FINDINGS: Cluster of calcifications in the proximal left ureter unchanged from the prior CT.  5 x 10 mm calcification to the right of L4 probably represents a right ureteral calculus which was not present in the ureter previously but may have migrated from the right lower pole.  Normal bowel gas pattern.  Mild lumbar degenerative change  IMPRESSION: Left proximal ureteral calculi unchanged from recent CT  New 5 x 10 mm calcification in the region of the right mid ureter.   Electronically Signed   By: Marlan Palau M.D.   On: 02/11/2017 09:57  CT scan and KUB personally reviewed today.  Assessment & Plan:    1. Right ureteral calculus Past spontaneously over weekend with medical expulsive therapy, bringing stone with her today Stone sent for stone analysis  2. Recurrent nephrolithiasis We discussed general stone prevention techniques including drinking plenty water with goal of producing 2.5 L urine daily, increased citric acid intake, avoidance of high oxalate containing foods, and decreased salt intake.  Information about dietary recommendations given today.   We'll monitor nonobstructing left calculi including 8 mm cluster of calculi in the left mid kidney as well as 4 mm calculus in the left lower pole. Punctate right stone residual.  Consider 24 hour in the future.  3. Left ureteral calculus History of ureteral perforation and radiographic ureteral calcification which is likely in the wall of the ureter based on previous ureteroscopic findings- no  hydro on most recent CT    Follow-up in November with KUB as scheduled.   Vanna Scotland, MD  [facility] [address] [place], [place] [address] [phone]  \\

## 2017-02-17 LAB — CULTURE, URINE COMPREHENSIVE

## 2017-02-23 ENCOUNTER — Ambulatory Visit: Payer: 59

## 2017-02-23 VITALS — BP 180/96 | HR 62 | Ht 65.0 in | Wt 238.5 lb

## 2017-02-23 DIAGNOSIS — R319 Hematuria, unspecified: Secondary | ICD-10-CM

## 2017-02-23 NOTE — Progress Notes (Signed)
Patient came by the office complaining of blood in urine no other symptoms present. Per Dr. Apolinar JunesBrandon send for culture will call with results.  Patient notified and is in agreement with this plan

## 2017-02-24 ENCOUNTER — Other Ambulatory Visit: Payer: Self-pay | Admitting: Urology

## 2017-02-24 LAB — URINALYSIS, COMPLETE
BILIRUBIN UA: NEGATIVE
GLUCOSE, UA: NEGATIVE
KETONES UA: NEGATIVE
NITRITE UA: NEGATIVE
SPEC GRAV UA: 1.025 (ref 1.005–1.030)
UUROB: 0.2 mg/dL (ref 0.2–1.0)
pH, UA: 5.5 (ref 5.0–7.5)

## 2017-02-24 LAB — MICROSCOPIC EXAMINATION
RBC, UA: >30 /hpf — ABNORMAL HIGH (ref 0–?)
WBC UA: NONE SEEN /HPF (ref 0–?)

## 2017-02-24 NOTE — Progress Notes (Signed)
Currently asymptomatic, urine mildly suspicious for infection. We will await urine culture and treat as needed.  This may be related to recent stone passage.    Vanna ScotlandAshley Aleasha Fregeau, MD

## 2017-02-25 LAB — CULTURE, URINE COMPREHENSIVE

## 2017-02-28 ENCOUNTER — Telehealth: Payer: Self-pay

## 2017-02-28 NOTE — Telephone Encounter (Signed)
-----   Message from Vanna ScotlandAshley Brandon, MD sent at 02/25/2017  4:52 PM EDT ----- UCx without obvious infection.  If bleeding persists, please let us know.    Vanna ScotlandAshley Brandon, MD

## 2017-02-28 NOTE — Telephone Encounter (Signed)
Called pt. Left vmail per DPR.  

## 2017-03-23 ENCOUNTER — Telehealth: Payer: Self-pay

## 2017-03-23 NOTE — Telephone Encounter (Signed)
Patient called in stating she thinks she has an UTI. Scheduled pt for nurse visit in the morning per patient's schedule. Patient agreed and verbalized understanding.

## 2017-03-24 ENCOUNTER — Ambulatory Visit (INDEPENDENT_AMBULATORY_CARE_PROVIDER_SITE_OTHER): Payer: 59

## 2017-03-24 ENCOUNTER — Telehealth: Payer: Self-pay

## 2017-03-24 VITALS — BP 162/102 | HR 52 | Ht 65.0 in | Wt 235.4 lb

## 2017-03-24 DIAGNOSIS — R300 Dysuria: Secondary | ICD-10-CM | POA: Diagnosis not present

## 2017-03-24 LAB — URINALYSIS, COMPLETE
BILIRUBIN UA: NEGATIVE
GLUCOSE, UA: NEGATIVE
KETONES UA: NEGATIVE
Leukocytes, UA: NEGATIVE
Nitrite, UA: NEGATIVE
Protein, UA: NEGATIVE
SPEC GRAV UA: 1.025 (ref 1.005–1.030)
Urobilinogen, Ur: 0.2 mg/dL (ref 0.2–1.0)
pH, UA: 5.5 (ref 5.0–7.5)

## 2017-03-24 LAB — MICROSCOPIC EXAMINATION

## 2017-03-24 NOTE — Telephone Encounter (Signed)
-----   Message from Laurie ScotlandAshley Brandon, MD sent at 03/24/2017  3:00 PM EDT ----- Urinalysis does not look particularly infected today. Urine culture sent, we'll let her know if it grows any bacteria. I would recommend if she continues to have frequency and the culture is negative, she should follow-up with us in the office to discuss management of her frequency.

## 2017-03-24 NOTE — Progress Notes (Signed)
Pt presents today with c/o UTI sx(s) x7 days:   Urinary Frequency  Pt has not been on antibiotics in the last 30 days. Pt is not on suppressive antibiotics. Pt has not had urological surgery in the last 30 days.  Vitals:   03/24/17 0849  BP: (!) 162/102  Pulse: (!) 52  Weight: 235 lb 6.4 oz (106.8 kg)  Height: 5\' 5"  (1.651 m)     Pt provided urine sample, advised pt on u/a and ucx turn-around times, and informed will be contacted when results reviewed by provider. Pt verbalized understanding.

## 2017-03-24 NOTE — Telephone Encounter (Signed)
Spoke to patient. Gave results and instructions. Patient verbalized understanding.  

## 2017-03-26 LAB — URINE CULTURE

## 2017-03-28 ENCOUNTER — Telehealth: Payer: Self-pay

## 2017-03-28 NOTE — Telephone Encounter (Signed)
-----   Message from Vanna ScotlandAshley Brandon, MD sent at 03/26/2017  2:33 PM EDT ----- UCx is negative.  No infection.  If you keep having symptoms, please make an appointment to discuss these further.

## 2017-03-28 NOTE — Telephone Encounter (Signed)
LMOM

## 2017-03-28 NOTE — Telephone Encounter (Signed)
Spoke with pt in reference to -ucx. Made aware will need an appt to discuss s/s further. Pt voiced understanding.

## 2017-07-13 ENCOUNTER — Ambulatory Visit: Payer: 59 | Admitting: Urology

## 2017-10-06 ENCOUNTER — Ambulatory Visit (INDEPENDENT_AMBULATORY_CARE_PROVIDER_SITE_OTHER): Payer: Managed Care, Other (non HMO)

## 2017-10-06 VITALS — BP 162/90 | HR 72 | Ht 63.0 in | Wt 230.0 lb

## 2017-10-06 DIAGNOSIS — R310 Gross hematuria: Secondary | ICD-10-CM

## 2017-10-06 LAB — URINALYSIS, COMPLETE
BILIRUBIN UA: NEGATIVE
GLUCOSE, UA: NEGATIVE
KETONES UA: NEGATIVE
LEUKOCYTES UA: NEGATIVE
Nitrite, UA: NEGATIVE
Urobilinogen, Ur: 0.2 mg/dL (ref 0.2–1.0)
pH, UA: 5.5 (ref 5.0–7.5)

## 2017-10-06 LAB — MICROSCOPIC EXAMINATION: RBC, UA: >30 /hpf — ABNORMAL HIGH (ref 0–?)

## 2017-10-06 NOTE — Progress Notes (Signed)
Pt presents today with blood in her urine. Pt voiced concern of UTI. A clean catch was obtained for u/a and cx.   Blood pressure (!) 162/90, pulse 72, height 5\' 3"  (1.6 m), weight 230 lb (104.3 kg).

## 2017-10-11 LAB — CULTURE, URINE COMPREHENSIVE

## 2017-10-12 ENCOUNTER — Telehealth: Payer: Self-pay

## 2017-10-12 NOTE — Telephone Encounter (Signed)
-----   Message from Riki AltesScott C Stoioff, MD sent at 10/12/2017  9:10 AM EST ----- Urine culture was negative for infection

## 2017-10-12 NOTE — Telephone Encounter (Signed)
Pt made aware of -ucx.

## 2017-11-27 ENCOUNTER — Emergency Department
Admission: EM | Admit: 2017-11-27 | Discharge: 2017-11-27 | Disposition: A | Discharge status: Home / Self Care | Payer: Managed Care, Other (non HMO) | Attending: Emergency Medicine | Admitting: Emergency Medicine

## 2017-11-27 ENCOUNTER — Other Ambulatory Visit: Payer: Self-pay

## 2017-11-27 ENCOUNTER — Emergency Department: Payer: Managed Care, Other (non HMO)

## 2017-11-27 DIAGNOSIS — Z79899 Other long term (current) drug therapy: Secondary | ICD-10-CM | POA: Diagnosis not present

## 2017-11-27 DIAGNOSIS — R1012 Left upper quadrant pain: Secondary | ICD-10-CM | POA: Diagnosis present

## 2017-11-27 DIAGNOSIS — E119 Type 2 diabetes mellitus without complications: Secondary | ICD-10-CM | POA: Insufficient documentation

## 2017-11-27 DIAGNOSIS — Z87891 Personal history of nicotine dependence: Secondary | ICD-10-CM | POA: Diagnosis not present

## 2017-11-27 DIAGNOSIS — I10 Essential (primary) hypertension: Secondary | ICD-10-CM | POA: Diagnosis not present

## 2017-11-27 DIAGNOSIS — N23 Unspecified renal colic: Secondary | ICD-10-CM | POA: Insufficient documentation

## 2017-11-27 LAB — URINALYSIS, COMPLETE (UACMP) WITH MICROSCOPIC
Bacteria, UA: NONE SEEN
Bilirubin Urine: NEGATIVE
GLUCOSE, UA: NEGATIVE mg/dL
KETONES UR: NEGATIVE mg/dL
Leukocytes, UA: NEGATIVE
Nitrite: NEGATIVE
PROTEIN: NEGATIVE mg/dL
Specific Gravity, Urine: 1.021 (ref 1.005–1.030)
pH: 5 (ref 5.0–8.0)

## 2017-11-27 LAB — BASIC METABOLIC PANEL
ANION GAP: 8 (ref 5–15)
BUN: 26 mg/dL — AB (ref 6–20)
CALCIUM: 8.2 mg/dL — AB (ref 8.9–10.3)
CO2: 24 mmol/L (ref 22–32)
Chloride: 109 mmol/L (ref 101–111)
Creatinine, Ser: 1.04 mg/dL — ABNORMAL HIGH (ref 0.44–1.00)
GFR calc Af Amer: >60 mL/min (ref >60)
GFR calc non Af Amer: 55 mL/min — ABNORMAL LOW (ref >60)
GLUCOSE: 133 mg/dL — AB (ref 65–99)
Potassium: 4.2 mmol/L (ref 3.5–5.1)
SODIUM: 141 mmol/L (ref 135–145)

## 2017-11-27 LAB — CBC
HCT: 40.8 % (ref 35.0–47.0)
HEMOGLOBIN: 13.7 g/dL (ref 12.0–16.0)
MCH: 27.9 pg (ref 26.0–34.0)
MCHC: 33.6 g/dL (ref 32.0–36.0)
MCV: 82.8 fL (ref 80.0–100.0)
Platelets: 215 10*3/uL (ref 150–440)
RBC: 4.93 MIL/uL (ref 3.80–5.20)
RDW: 14.4 % (ref 11.5–14.5)
WBC: 6.2 10*3/uL (ref 3.6–11.0)

## 2017-11-27 MED ORDER — HYDROCODONE-ACETAMINOPHEN 5-325 MG PO TABS: 1.0000 | ORAL_TABLET | Freq: Four times a day (QID) | ORAL | 0 refills | Status: AC | PRN

## 2017-11-27 MED ORDER — HYDROCODONE-ACETAMINOPHEN 5-325 MG PO TABS
1.0000 | ORAL_TABLET | Freq: Once | ORAL | Status: AC
  Administered 2017-11-27: 1 via ORAL
  Filled 2017-11-27: qty 1

## 2017-11-27 MED ORDER — TAMSULOSIN HCL 0.4 MG PO CAPS: 0.4000 mg | ORAL_CAPSULE | Freq: Every day | ORAL | 0 refills | Status: DC

## 2017-11-27 MED ORDER — IBUPROFEN 600 MG PO TABS
600.0000 mg | ORAL_TABLET | Freq: Once | ORAL | Status: AC
  Administered 2017-11-27: 600 mg via ORAL
  Filled 2017-11-27: qty 1

## 2017-11-27 NOTE — Discharge Instructions (Signed)
Please take your pain medication as needed for severe symptoms and use your Flomax every single day to help your stone pass.  Make an appointment to follow-up with your urologist within the next week or so for reevaluation.  Return to the emergency department immediately if you develop any fevers or chills.  It was a pleasure to take care of you today, and thank you for coming to our emergency department.  If you have any questions or concerns before leaving please ask the nurse to grab me and I'm more than happy to go through your aftercare instructions again.  If you were prescribed any opioid pain medication today such as Norco, Vicodin, Percocet, morphine, hydrocodone, or oxycodone please make sure you do not drive when you are taking this medication as it can alter your ability to drive safely.  If you have any concerns once you are home that you are not improving or are in fact getting worse before you can make it to your follow-up appointment, please do not hesitate to call 911 and come back for further evaluation.  Merrily BrittleNeil Thomasene Dubow, MD  Results for orders placed or performed during the hospital encounter of 11/27/17  Urinalysis, Complete w Microscopic  Result Value Ref Range   Color, Urine YELLOW (A) YELLOW   APPearance HAZY (A) CLEAR   Specific Gravity, Urine 1.021 1.005 - 1.030   pH 5.0 5.0 - 8.0   Glucose, UA NEGATIVE NEGATIVE mg/dL   Hgb urine dipstick SMALL (A) NEGATIVE   Bilirubin Urine NEGATIVE NEGATIVE   Ketones, ur NEGATIVE NEGATIVE mg/dL   Protein, ur NEGATIVE NEGATIVE mg/dL   Nitrite NEGATIVE NEGATIVE   Leukocytes, UA NEGATIVE NEGATIVE   RBC / HPF 6-30 0 - 5 RBC/hpf   WBC, UA 0-5 0 - 5 WBC/hpf   Bacteria, UA NONE SEEN NONE SEEN   Squamous Epithelial / LPF 0-5 (A) NONE SEEN   Mucus PRESENT   Basic metabolic panel  Result Value Ref Range   Sodium 141 135 - 145 mmol/L   Potassium 4.2 3.5 - 5.1 mmol/L   Chloride 109 101 - 111 mmol/L   CO2 24 22 - 32 mmol/L   Glucose,  Bld 133 (H) 65 - 99 mg/dL   BUN 26 (H) 6 - 20 mg/dL   Creatinine, Ser 8.291.04 (H) 0.44 - 1.00 mg/dL   Calcium 8.2 (L) 8.9 - 10.3 mg/dL   GFR calc non Af Amer 55 (L) >60 mL/min   GFR calc Af Amer >60 >60 mL/min   Anion gap 8 5 - 15  CBC  Result Value Ref Range   WBC 6.2 3.6 - 11.0 K/uL   RBC 4.93 3.80 - 5.20 MIL/uL   Hemoglobin 13.7 12.0 - 16.0 g/dL   HCT 56.240.8 13.035.0 - 86.547.0 %   MCV 82.8 80.0 - 100.0 fL   MCH 27.9 26.0 - 34.0 pg   MCHC 33.6 32.0 - 36.0 g/dL   RDW 78.414.4 69.611.5 - 29.514.5 %   Platelets 215 150 - 440 K/uL   Ct Renal Stone Study  Result Date: 11/27/2017 CLINICAL DATA:  [age] year old female with left flank pain. Recurrent stone disease. EXAM: CT ABDOMEN AND PELVIS WITHOUT CONTRAST TECHNIQUE: Multidetector CT imaging of the abdomen and pelvis was performed following the standard protocol without IV contrast. COMPARISON:  CT of the abdomen pelvis dated 01/04/2017 FINDINGS: Evaluation of this exam is limited in the absence of intravenous contrast. Lower chest: The visualized lung bases are clear. No intra-abdominal free air or free  fluid. Hepatobiliary: The liver is unremarkable. No intrahepatic biliary ductal dilatation. Layering stones noted within the gallbladder. No pericholecystic fluid. Pancreas: Unremarkable. No pancreatic ductal dilatation or surrounding inflammatory changes. Spleen: Normal in size without focal abnormality. Adrenals/Urinary Tract: The adrenal glands are unremarkable. There appears to be three adjacent stones in the proximal left ureter close to the UPJ. Two adjacent stones or a bilobed stone measures approximately 6 x 7 mm in greatest dimensions and correspond to the stone seen in the interpolar aspect of the left kidney on the prior CT. A third stone measures approximately 4 mm likely a fragment from the inferior pole calculus seen on the prior CT. There is associated mild left hydronephrosis. Additional smaller nonobstructing left renal calculi as well as a 3 mm  nonobstructing right renal interpolar stone are seen. There is no hydronephrosis on the right. The right ureter and urinary bladder appear unremarkable. There is stranding of the left perinephric fat. Correlation with urinalysis recommended to exclude superimposed UTI. Stomach/Bowel: There is postsurgical changes of gastric bypass. There is no bowel obstruction or active inflammation. There is sigmoid diverticulosis without active inflammatory changes. The appendix is normal. Vascular/Lymphatic: Mild aortoiliac atherosclerotic disease. The abdominal aorta and IVC are otherwise unremarkable on this noncontrast CT. No portal venous gas. There is no adenopathy. Reproductive: Hysterectomy.  No pelvic mass. Other: Small fat containing umbilical hernia. Musculoskeletal: Osteopenia with degenerative changes of the spine. Grade 1 L4-L5 anterolisthesis. No acute osseous pathology. IMPRESSION: 1. Clusters of three adjacent stones in the proximal left ureter/UPJ with mild left hydronephrosis. Additional nonobstructing bilateral renal calculi as described above. 2. Sigmoid diverticulosis. No bowel obstruction or active inflammation. Normal appendix. 3. Cholelithiasis. Electronically Signed   By: Elgie Collard M.D.   On: 11/27/2017 06:00

## 2017-11-27 NOTE — ED Provider Notes (Signed)
[facility] Emergency Department Provider Note  ____________________________________________   None    (approximate)  I have reviewed the triage vital signs and the nursing notes.   HISTORY  Chief Complaint Flank Pain and Abdominal Pain    HPI Laurie Dickson is a 67 y.o. female who self presents to the emergency department with sudden onset severe left flank pain radiating towards her left groin that began at rest around 6 PM yesterday.  She took a tablet of Vicodin she had at home which seemed to help her symptoms.  She has had some nausea but no vomiting.  No fevers or chills.  No dysuria frequency or hesitancy.  She has a remote history of gastric bypass.  She is also had previous kidney stones.  This does not feel exactly like her previous stones.  Past Medical History:  Diagnosis Date  . Calculus of kidney 01/25/2013  . Chronic kidney disease    stones,hydronephrosis  . Diabetes (HCC)   . Diabetes mellitus without complication (HCC)    Resolved since bariatric surgery  . Difficult intubation   . Edema   . Edema extremities   . Hematuria, microscopic 01/12/2013  . Hydronephrosis   . Hypertension   . Kidney stone   . Obesity   . RLS (restless legs syndrome)   . Sleep apnea     Patient Active Problem List   Diagnosis Date Noted  . Encounter for general adult medical examination without abnormal findings 08/04/2015  . Ureteral stone with hydronephrosis 02/06/2015  . Restless leg 12/12/2014  . Extreme obesity 06/09/2014  . Morbid obesity (HCC) 06/09/2014  . Diabetes mellitus, type 2 (HCC) 03/24/2014  . Accumulation of fluid in tissues 03/24/2014  . Benign hypertension 03/24/2014  . Type II diabetes mellitus with manifestations (HCC) 03/24/2014  . Adiposity 03/24/2014  . Type 2 diabetes mellitus with other specified complication (HCC) 03/24/2014  . Type 2 diabetes mellitus (HCC) 03/24/2014  . Edema 03/24/2014  . Calculus of kidney  01/25/2013  . Hematuria, microscopic 01/12/2013  . Microscopic hematuria 01/12/2013    Past Surgical History:  Procedure Laterality Date  . ABDOMINAL HYSTERECTOMY    . BACK SURGERY    . BARIATRIC SURGERY    . CYSTOSCOPY W/ RETROGRADES Left 01/20/2015   Procedure: CYSTOSCOPY WITH RETROGRADE PYELOGRAM;  Surgeon: Lorraine Lax, MD;  Location: [facility];  Service: Urology;  Laterality: Left;  . CYSTOSCOPY WITH STENT PLACEMENT N/A 01/20/2015   Procedure: CYSTOSCOPY WITH STENT PLACEMENT;  Surgeon: Lorraine Lax, MD;  Location: [facility];  Service: Urology;  Laterality: N/A;  . CYSTOSCOPY/URETEROSCOPY/HOLMIUM LASER/STENT PLACEMENT Left 09/10/2015   Procedure: CYSTOSCOPY/URETEROSCOPY//STENT PLACEMENT/retrograde pyelogram/stone basketing;  Surgeon: Vanna Scotland, MD;  Location: [facility];  Service: Urology;  Laterality: Left;  . EXTRACORPOREAL SHOCK WAVE LITHOTRIPSY Left 04/24/2015   Procedure: EXTRACORPOREAL SHOCK WAVE LITHOTRIPSY (ESWL);  Surgeon: Lorraine Lax, MD;  Location: [facility];  Service: Urology;  Laterality: Left;  . EXTRACORPOREAL SHOCK WAVE LITHOTRIPSY Right 03/13/2015   Procedure: EXTRACORPOREAL SHOCK WAVE LITHOTRIPSY (ESWL);  Surgeon: Vanna Scotland, MD;  Location: [facility];  Service: Urology;  Laterality: Right;  . EXTRACORPOREAL SHOCK WAVE LITHOTRIPSY Left 07/03/2015   Procedure: EXTRACORPOREAL SHOCK WAVE LITHOTRIPSY (ESWL);  Surgeon: Lorraine Lax, MD;  Location: [facility];  Service: Urology;  Laterality: Left;  . EXTRACORPOREAL SHOCK WAVE LITHOTRIPSY Left 08/21/2015   Procedure: EXTRACORPOREAL SHOCK WAVE LITHOTRIPSY (ESWL);  Surgeon: Vanna Scotland, MD;  Location: [facility];  Service: Urology;  Laterality: Left;  .  OOPHORECTOMY    . salpingectomy    . URETEROSCOPY WITH HOLMIUM LASER LITHOTRIPSY Left 01/20/2015   Procedure: URETEROSCOPY WITH HOLMIUM LASER LITHOTRIPSY;  Surgeon: Lorraine Lax, MD;  Location: [facility];  Service: Urology;  Laterality: Left;    Prior to Admission  medications   Medication Sig Start Date End Date Taking? Authorizing Provider  HYDROcodone-acetaminophen (NORCO) 5-325 MG tablet Take 1 tablet by mouth every 6 (six) hours as needed for up to 15 doses for severe pain. 11/27/17   Merrily Brittle, MD  rOPINIRole (REQUIP) 0.5 MG tablet Take 1.5 mg by mouth at bedtime.     [provider]  tamsulosin (FLOMAX) 0.4 MG CAPS capsule Take 1 capsule (0.4 mg total) by mouth daily. 11/29/17   Michiel Cowboy A, PA-C    Allergies Sulfa antibiotics  Family History  Problem Relation Age of Onset  . Kidney cancer Father   . Bladder Cancer Neg Hx   . Breast cancer Neg Hx     Social History Social History   Tobacco Use  . Smoking status: Former Smoker    Last attempt to quit: 01/15/2011    Years since quitting: 6.8  . Smokeless tobacco: Never Used  Substance Use Topics  . Alcohol use: No    Alcohol/week: 0.0 oz  . Drug use: No    Review of Systems Constitutional: No fever/chills Eyes: No visual changes. ENT: No sore throat. Cardiovascular: Denies chest pain. Respiratory: Denies shortness of breath. Gastrointestinal: Positive for abdominal pain.  Positive for nausea, no vomiting.  No diarrhea.  No constipation. Genitourinary: Negative for dysuria. Musculoskeletal: Positive for back pain. Skin: Negative for rash. Neurological: Negative for headaches, focal weakness or numbness.   ____________________________________________   PHYSICAL EXAM:  VITAL SIGNS: ED Triage Vitals  Enc Vitals Group     BP 11/27/17 0434 (!) 179/86     Pulse Rate 11/27/17 0434 (!) 57     Resp 11/27/17 0434 18     Temp 11/27/17 0434 98.1 F (36.7 C)     Temp Source 11/27/17 0434 Oral     SpO2 11/27/17 0434 98 %     Weight 11/27/17 0432 210 lb (95.3 kg)     Height 11/27/17 0432 5\' 3"  (1.6 m)     Head Circumference --      Peak Flow --      Pain Score 11/27/17 0432 8     Pain Loc --      Pain Edu? --      Excl. in GC? --     Constitutional:  Alert and oriented x4 pleasant cooperative somewhat uncomfortable appearing nontoxic no diaphoresis speaks in full clear sentences Eyes: PERRL EOMI. Head: Atraumatic. Nose: No congestion/rhinnorhea. Mouth/Throat: No trismus Neck: No stridor.   Cardiovascular: Bradycardic rate, regular rhythm. Grossly normal heart sounds.  Good peripheral circulation. Respiratory: Normal respiratory effort.  No retractions. Lungs CTAB and moving good air Gastrointestinal: Soft obese abdomen nondistended nontender no rebound or guarding no peritonitis no costovertebral tenderness Musculoskeletal: No lower extremity edema   Neurologic:  Normal speech and language. No gross focal neurologic deficits are appreciated. Skin:  Skin is warm, dry and intact. No rash noted. Psychiatric: Mood and affect are normal. Speech and behavior are normal.    ____________________________________________   DIFFERENTIAL includes but not limited to  Renal colic, AAA, pyelonephritis, internal hernia, diverticulitis ____________________________________________   LABS (all labs ordered are listed, but only abnormal results are displayed)  Labs Reviewed  URINALYSIS, COMPLETE (UACMP) WITH  MICROSCOPIC - Abnormal; Notable for the following components:      Result Value   Color, Urine YELLOW (*)    APPearance HAZY (*)    Hgb urine dipstick SMALL (*)    Squamous Epithelial / LPF 0-5 (*)    All other components within normal limits  BASIC METABOLIC PANEL - Abnormal; Notable for the following components:   Glucose, Bld 133 (*)    BUN 26 (*)    Creatinine, Ser 1.04 (*)    Calcium 8.2 (*)    GFR calc non Af Amer 55 (*)    All other components within normal limits  CBC    Lab work reviewed by me with hematuria but no evidence of infection __________________________________________  EKG   ____________________________________________  RADIOLOGY  CT stone reviewed by me with left-sided obstructive kidney stone with  hydronephrosis ____________________________________________   PROCEDURES  Procedure(s) performed: no  Procedures  Critical Care performed: no  Observation: no ____________________________________________   INITIAL IMPRESSION / ASSESSMENT AND PLAN / ED COURSE  Pertinent labs & imaging results that were available during my care of the patient were reviewed by me and considered in my medical decision making (see chart for details).  The patient arrives with sudden onset severe left flank pain radiating to her groin along with hematuria concerning for renal colic.  She does have a complex abdomen with her previous history of gastric bypass and given the diagnostic uncertainty I do think she warrants a CT scan to further evaluate.  She does request further pain medication so ibuprofen and hydrocodone now.     The patient CT scan shows 3 nonobstructing stones.  Her pain is adequately controlled.  No evidence of pyelonephritis.  Will be discharged home with urology follow-up.  The patient verbalizes understanding and agreement with the plan. ____________________________________________   FINAL CLINICAL IMPRESSION(S) / ED DIAGNOSES  Final diagnoses:  Renal colic      NEW MEDICATIONS STARTED DURING THIS VISIT:  Discharge Medication List as of 11/27/2017  6:11 AM    START taking these medications   Details  HYDROcodone-acetaminophen (NORCO) 5-325 MG tablet Take 1 tablet by mouth every 6 (six) hours as needed for up to 15 doses for severe pain., Starting Sun 11/27/2017, Print         Note:  This document was prepared using Dragon voice recognition software and may include unintentional dictation errors.     Merrily Brittleifenbark, Cobe Viney, MD 11/30/17 [phone]

## 2017-11-27 NOTE — ED Triage Notes (Signed)
Patient reports having left flank pain that radiates into left abdomen since 6 pm.  Reports history of kidney stones.

## 2017-11-28 NOTE — Progress Notes (Signed)
11/29/2017 1:18 PM   Laurie Dickson 08-May-1951 [id number]  Referring provider: Kirk Ruths, MD [facility] [place], [place] [address]    HPI: Patient is a 67 year old Caucasian female with a history of nephrolithiasis who presents today for follow-up after being seen in the emergency room for left UPJ stones.  Background history  67 year old female with history of nephrolithiasis who is recently seen for routine follow-up, had a CT scan on 01/05/2017 with bilateral nephrolithiasis and a nonobstructing left intramural ureteral calculus with history of known perforation at that site.  She subsequently presented to our office with right flank pain and urinary symptoms last week. KUB at that time demonstrated a 10 mm right ureteral calculus. She was given Flomax, pain medication, strict return symptoms and treated with medical expulsive therapy over the weekend.  She did spontaneously pass the stone on Saturday which she brings with her today. The size of the stone corresponds with the KUB findings. Her severe flank pain has resolved and she is no longer requiring narcotics. She does have some dull tenderness in her flank which she describes as soreness.  She has had multiple ESWL procedures on the left and was also treated ureteroscopically for a left proximal ureteral stone complicated by perforation by Dr. Elnoria Howard. A follow-up CT scan did suggest that there was a 10 mm proximal residual intraluminal stone. She returned to the operating room on 09/10/2015 for further treatment of this possible stone at which time no stone was identified in the lumen was found to be patent. The stone was presumably intramural or outside of the ureter which is not consistent with the CT scan findings. There is no obvious obstruction.  She did also have a history left perinephric hematoma which resolved on follow up ultrasounds as of 11/2015.  Stone analysis demonstrates a 94%  calcium oxalate monohydrate, 3% calcium oxalate dihydrate 3% calcium phosphate carbonate stone.    She presented to the ED on 11/27/2017 for left flank pain.  CT Renal stone study performed on 11/27/2017 noted the adrenal glands are unremarkable. There appears to be three adjacent stones in the proximal left ureter close to the UPJ. Two adjacent stones or a bilobed stone measures approximately 6 x 7 mm in greatest dimensions and correspond to the stone seen in the interpolar aspect of the left kidney on the prior CT. A third stone measures approximately 4 mm likely a fragment from the inferior pole calculus seen on the prior CT. There is associated mild left hydronephrosis. Additional smaller nonobstructing left renal calculi as well as a 3 mm nonobstructing right renal interpolar stone are seen. There is no hydronephrosis on the right.  The right ureter and urinary bladder appear unremarkable. There is stranding of the left perinephric fat. Correlation with urinalysis recommended to exclude superimposed UTI.  Her WBC count was 6.2.  Her serum creatinine was 1.04.  Her UA was positive for 6-30 RBC's and 0-5 WBC's.  She was discharged with hydrocodone/APAP 5/325 mg and tamsulosin 0.4 mg daily.    Today, she is having left flank pain that radiates to the right waist.  No pain today.  She has not passed any fragments.  No pain meds today.  Patient denies any gross hematuria, dysuria or suprapubic/flank pain.  Patient denies any fevers, chills, nausea or vomiting.   Her UA is positive for 11-30 WBC's, 11-30 RBC's and a few bacteria.   No passage of fragments.  PMH: Past Medical History:  Diagnosis Date  . Calculus of kidney 01/25/2013  . Chronic kidney disease    stones,hydronephrosis  . Diabetes ([facility])   . Diabetes mellitus without complication ([place])    Resolved since bariatric surgery  . Difficult intubation   . Edema   . Edema extremities   . Hematuria, microscopic 01/12/2013  . Hydronephrosis   .  Hypertension   . Kidney stone   . Obesity   . RLS (restless legs syndrome)   . Sleep apnea     Surgical History: Past Surgical History:  Procedure Laterality Date  . ABDOMINAL HYSTERECTOMY    . BACK SURGERY    . BARIATRIC SURGERY    . CYSTOSCOPY W/ RETROGRADES Left 01/20/2015   Procedure: CYSTOSCOPY WITH RETROGRADE PYELOGRAM;  Surgeon: Collier Flowers, MD;  Location: [facility];  Service: Urology;  Laterality: Left;  . CYSTOSCOPY WITH STENT PLACEMENT N/A 01/20/2015   Procedure: CYSTOSCOPY WITH STENT PLACEMENT;  Surgeon: Collier Flowers, MD;  Location: [facility];  Service: Urology;  Laterality: N/A;  . CYSTOSCOPY/URETEROSCOPY/HOLMIUM LASER/STENT PLACEMENT Left 09/10/2015   Procedure: CYSTOSCOPY/URETEROSCOPY//STENT PLACEMENT/retrograde pyelogram/stone basketing;  Surgeon: Hollice Espy, MD;  Location: [facility];  Service: Urology;  Laterality: Left;  . EXTRACORPOREAL SHOCK WAVE LITHOTRIPSY Left 04/24/2015   Procedure: EXTRACORPOREAL SHOCK WAVE LITHOTRIPSY (ESWL);  Surgeon: Collier Flowers, MD;  Location: [facility];  Service: Urology;  Laterality: Left;  . EXTRACORPOREAL SHOCK WAVE LITHOTRIPSY Right 03/13/2015   Procedure: EXTRACORPOREAL SHOCK WAVE LITHOTRIPSY (ESWL);  Surgeon: Hollice Espy, MD;  Location: [facility];  Service: Urology;  Laterality: Right;  . EXTRACORPOREAL SHOCK WAVE LITHOTRIPSY Left 07/03/2015   Procedure: EXTRACORPOREAL SHOCK WAVE LITHOTRIPSY (ESWL);  Surgeon: Collier Flowers, MD;  Location: [facility];  Service: Urology;  Laterality: Left;  . EXTRACORPOREAL SHOCK WAVE LITHOTRIPSY Left 08/21/2015   Procedure: EXTRACORPOREAL SHOCK WAVE LITHOTRIPSY (ESWL);  Surgeon: Hollice Espy, MD;  Location: [facility];  Service: Urology;  Laterality: Left;  . OOPHORECTOMY    . salpingectomy    . URETEROSCOPY WITH HOLMIUM LASER LITHOTRIPSY Left 01/20/2015   Procedure: URETEROSCOPY WITH HOLMIUM LASER LITHOTRIPSY;  Surgeon: Collier Flowers, MD;  Location: [facility];  Service: Urology;  Laterality: Left;     Home Medications:  Allergies as of 11/29/2017      Reactions   Sulfa Antibiotics Rash      Medication List        Accurate as of 11/29/17  1:18 PM. Always use your most recent med list.          HYDROcodone-acetaminophen 5-325 MG tablet Commonly known as:  NORCO Take 1 tablet by mouth every 6 (six) hours as needed for up to 15 doses for severe pain.   rOPINIRole 0.5 MG tablet Commonly known as:  REQUIP Take 1.5 mg by mouth at bedtime.   tamsulosin 0.4 MG Caps capsule Commonly known as:  FLOMAX Take 1 capsule (0.4 mg total) by mouth daily.       Allergies:  Allergies  Allergen Reactions  . Sulfa Antibiotics Rash    Family History: Family History  Problem Relation Age of Onset  . Kidney cancer Father   . Bladder Cancer Neg Hx   . Breast cancer Neg Hx     Social History:  reports that she quit smoking about 6 years ago. She has never used smokeless tobacco. She reports that she does not drink alcohol or use drugs.  ROS: UROLOGY Frequent Urination?: No Hard to postpone urination?: No Burning/pain with  urination?: No Get up at night to urinate?: No Leakage of urine?: No Urine stream starts and stops?: No Trouble starting stream?: No Do you have to strain to urinate?: No Blood in urine?: No Urinary tract infection?: No Sexually transmitted disease?: No Injury to kidneys or bladder?: No Painful intercourse?: No Weak stream?: No Currently pregnant?: No Vaginal bleeding?: No Last menstrual period?: N  Gastrointestinal Nausea?: No Vomiting?: No Indigestion/heartburn?: No Diarrhea?: No Constipation?: No  Constitutional Fever: No Night sweats?: No Weight loss?: No Fatigue?: No  Skin Skin rash/lesions?: No Itching?: No  Eyes Blurred vision?: No Double vision?: No  Ears/Nose/Throat Sore throat?: No Sinus problems?: No  Hematologic/Lymphatic Swollen glands?: No Easy bruising?: No  Cardiovascular Leg swelling?: No Chest pain?:  No  Respiratory Cough?: No Shortness of breath?: No  Endocrine Excessive thirst?: No  Musculoskeletal Back pain?: No Joint pain?: No  Neurological Headaches?: No Dizziness?: No  Psychologic Depression?: No Anxiety?: No  Physical Exam: BP (!) 157/84 (BP Location: Right Arm, Patient Position: Sitting, Cuff Size: Normal)   Pulse 83   Ht '5\' 3"'$  (1.6 m)   Wt 242 lb 9.6 oz (110 kg)   BMI 42.97 kg/m   Constitutional: Well nourished. Alert and oriented, No acute distress. HEENT: [place] AT, moist mucus membranes. Trachea midline, no masses. Cardiovascular: No clubbing, cyanosis, or edema. Respiratory: Normal respiratory effort, no increased work of breathing. GI: Abdomen is soft, non tender, non distended, no abdominal masses. Liver and spleen not palpable.  No hernias appreciated.  Stool sample for occult testing is not indicated.   GU: No CVA tenderness.  No bladder fullness or masses.   Skin: No rashes, bruises or suspicious lesions. Lymph: No cervical or inguinal adenopathy. Neurologic: Grossly intact, no focal deficits, moving all 4 extremities. Psychiatric: Normal mood and affect.  Laboratory Data: Lab Results  Component Value Date   WBC 6.2 11/27/2017   HGB 13.7 11/27/2017   HCT 40.8 11/27/2017   MCV 82.8 11/27/2017   PLT 215 11/27/2017    Lab Results  Component Value Date   CREATININE 1.04 (H) 11/27/2017    Pertinent Imaging: CLINICAL DATA:  67 year old female with left flank pain. Recurrent stone disease.  EXAM: CT ABDOMEN AND PELVIS WITHOUT CONTRAST  TECHNIQUE: Multidetector CT imaging of the abdomen and pelvis was performed following the standard protocol without IV contrast.  COMPARISON:  CT of the abdomen pelvis dated 01/04/2017  FINDINGS: Evaluation of this exam is limited in the absence of intravenous contrast.  Lower chest: The visualized lung bases are clear.  No intra-abdominal free air or free fluid.  Hepatobiliary: The liver is  unremarkable. No intrahepatic biliary ductal dilatation. Layering stones noted within the gallbladder. No pericholecystic fluid.  Pancreas: Unremarkable. No pancreatic ductal dilatation or surrounding inflammatory changes.  Spleen: Normal in size without focal abnormality.  Adrenals/Urinary Tract: The adrenal glands are unremarkable. There appears to be three adjacent stones in the proximal left ureter close to the UPJ. Two adjacent stones or a bilobed stone measures approximately 6 x 7 mm in greatest dimensions and correspond to the stone seen in the interpolar aspect of the left kidney on the prior CT. A third stone measures approximately 4 mm likely a fragment from the inferior pole calculus seen on the prior CT. There is associated mild left hydronephrosis. Additional smaller nonobstructing left renal calculi as well as a 3 mm nonobstructing right renal interpolar stone are seen. There is no hydronephrosis on the right. The right ureter and urinary bladder  appear unremarkable. There is stranding of the left perinephric fat. Correlation with urinalysis recommended to exclude superimposed UTI.  Stomach/Bowel: There is postsurgical changes of gastric bypass. There is no bowel obstruction or active inflammation. There is sigmoid diverticulosis without active inflammatory changes. The appendix is normal.  Vascular/Lymphatic: Mild aortoiliac atherosclerotic disease. The abdominal aorta and IVC are otherwise unremarkable on this noncontrast CT. No portal venous gas. There is no adenopathy.  Reproductive: Hysterectomy.  No pelvic mass.  Other: Small fat containing umbilical hernia.  Musculoskeletal: Osteopenia with degenerative changes of the spine. Grade 1 L4-L5 anterolisthesis. No acute osseous pathology.  IMPRESSION: 1. Clusters of three adjacent stones in the proximal left ureter/UPJ with mild left hydronephrosis. Additional nonobstructing bilateral renal calculi as  described above. 2. Sigmoid diverticulosis. No bowel obstruction or active inflammation. Normal appendix. 3. Cholelithiasis.   Electronically Signed   By: Anner Crete M.D.   On: 11/27/2017 06:00 I have independently reviewed the films  Assessment & Plan:    1. Left ureteral stone  - explained to the patient that AUA Guidelines for patients with uncomplicated ureteral stones ?10 mm should be offered observation, and those with distal stones of similar size should be offered MET with ?-blockers - she will continue MET with tamsulosin 0.4 mg daily and taking the hydrocodone/APAP prn for pain and strain the urine  - if after 4 to 6 weeks observation with or without MET is not successful we would pursue URS or ESWL, if the patient/clinician decide to intervene sooner based on a shared decision making approach, the clinicians should offer definitive stone treatment  -URS would be the first lined therapy if stone(s) do not pass, but ESWL is the procedure with the least morbidity and lowest complication rate, but URS has a greater stone-free rate in a single procedure  - she would like to have ESWL for her definitive treatment of her left ureteral stone(s)  - I explained that ESWL is a means of pulverizing urinary stones without surgery using shockwave therapy  - I discussed the risks involved with ESWL consist of bruising to the skin and kidney region as a result of the shockwave, possibility of long-term kidney damage, development of high blood pressure and damage to the bowel or long, hematuria, urinary bleeding serious enough to require transfusion or surgical repair or removal of the kidney is rare, rare chance of hematoma formation in the kidney and injuries to the spleen, liver or pancreas.  There is also the risk of urinary tract infection or any infection of the blood system or tissue.   There is also a possibility of resultant damage to female organs.  - I explained that sometimes the  stone fragments can stack up in the ureter like coins, a phenomenon described as "Steinstrasse", that would result in a stent placement and/or URS for further treatment  - I informed the patient that IV sedation is typically used on the truck, but it some rare instances we need to use general anesthesia - the risks being infection, irregular heart beat, irregular BP, stroke, MI, CVA, paralysis, coma and/or death.  - did advise the patient that the calcification could be a cluster of stones or one stone - if it is a cluster of stones and they start migrating down the ureter separately she still may not be a candidate for ESWL and will need to be scheduled for URS - she will have a KUB two days prior to the procedure to evaluate the position of  the stones  - Advised to contact our office or seek treatment in the ED if becomes febrile or pain/ vomiting are difficult control in order to arrange for emergent/urgent intervention  2. Left hydronephrosis  - obtain RUS to ensure the hydronephrosis has resolved once she has recovered from the treatment of the left UPJ stone  3. Microscopic hematuria  - UA today demonstrates 11-30 RBC's  - continue to monitor the patient's UA after the treatment/passage of the stone to ensure the hematuria has resolved  - if hematuria persists, we will pursue a hematuria workup with CT Urogram and cystoscopy if appropriate.  4. Recurrent nephrolithiasis Consider 24 hour in the future.    Zara Council, PA-C  [facility] [address] [place], [place], [place] [address] [phone]  \\

## 2017-11-29 ENCOUNTER — Other Ambulatory Visit: Payer: Self-pay | Admitting: Radiology

## 2017-11-29 ENCOUNTER — Encounter: Payer: Self-pay | Admitting: Urology

## 2017-11-29 ENCOUNTER — Ambulatory Visit: Payer: Managed Care, Other (non HMO) | Admitting: Urology

## 2017-11-29 VITALS — BP 157/84 | HR 83 | Ht 63.0 in | Wt 242.6 lb

## 2017-11-29 DIAGNOSIS — Z87442 Personal history of urinary calculi: Secondary | ICD-10-CM

## 2017-11-29 DIAGNOSIS — R3129 Other microscopic hematuria: Secondary | ICD-10-CM

## 2017-11-29 DIAGNOSIS — N201 Calculus of ureter: Secondary | ICD-10-CM

## 2017-11-29 DIAGNOSIS — N132 Hydronephrosis with renal and ureteral calculous obstruction: Secondary | ICD-10-CM | POA: Diagnosis not present

## 2017-11-29 LAB — MICROSCOPIC EXAMINATION

## 2017-11-29 LAB — URINALYSIS, COMPLETE
BILIRUBIN UA: NEGATIVE
GLUCOSE, UA: NEGATIVE
KETONES UA: NEGATIVE
NITRITE UA: NEGATIVE
SPEC GRAV UA: 1.025 (ref 1.005–1.030)
UUROB: 1 mg/dL (ref 0.2–1.0)
pH, UA: 6 (ref 5.0–7.5)

## 2017-11-29 MED ORDER — TAMSULOSIN HCL 0.4 MG PO CAPS: 0.4000 mg | ORAL_CAPSULE | Freq: Every day | ORAL | 0 refills | Status: AC

## 2017-12-01 LAB — CULTURE, URINE COMPREHENSIVE

## 2017-12-07 ENCOUNTER — Ambulatory Visit
Admission: RE | Admit: 2017-12-07 | Discharge: 2017-12-07 | Disposition: A | Discharge status: Home / Self Care | Payer: Managed Care, Other (non HMO) | Source: Ambulatory Visit | Attending: Urology | Admitting: Urology

## 2017-12-07 ENCOUNTER — Telehealth: Payer: Self-pay | Admitting: Urology

## 2017-12-07 DIAGNOSIS — N201 Calculus of ureter: Secondary | ICD-10-CM | POA: Insufficient documentation

## 2017-12-07 DIAGNOSIS — N132 Hydronephrosis with renal and ureteral calculous obstruction: Secondary | ICD-10-CM

## 2017-12-07 NOTE — Telephone Encounter (Signed)
Patient is scheduled for shockwave lithotripsy tomorrow.  She had a KUB today which shows a calcification in her left proximal ureter at the known location of an intramural stone.  I do not see a second stone adjacent to this on plain film.  I called Ms. Laurie Dickson to discuss the findings.  She has not had pain since her ER visit on 11/27/2017.  As such, we will go ahead and defer shockwave lithotripsy tomorrow.  Plan for renal ultrasound to assess for resolution of her hydronephrosis as she may have passed the stone in the interim.  She is agreeable this plan.  Warning symptoms reviewed.  Vanna ScotlandAshley Devereaux Grayson, MD

## 2017-12-08 ENCOUNTER — Encounter: Admission: RE | Payer: Self-pay | Source: Ambulatory Visit

## 2017-12-08 ENCOUNTER — Ambulatory Visit: Admission: RE | Admit: 2017-12-08 | Payer: Managed Care, Other (non HMO) | Source: Ambulatory Visit | Admitting: Urology

## 2017-12-08 SURGERY — LITHOTRIPSY, ESWL
Anesthesia: Moderate Sedation | Laterality: Left

## 2017-12-08 NOTE — Telephone Encounter (Signed)
Keeping follow up would be great!  Laurie ScotlandAshley Kris No, MD

## 2017-12-08 NOTE — Telephone Encounter (Signed)
Do you want her to keep her follow up app on 12-22-17 to go over results or will you call her?  Laurie DusterMichelle

## 2017-12-09 ENCOUNTER — Ambulatory Visit
Admission: RE | Admit: 2017-12-09 | Discharge: 2017-12-09 | Disposition: A | Discharge status: Home / Self Care | Payer: Managed Care, Other (non HMO) | Source: Ambulatory Visit | Attending: Urology | Admitting: Urology

## 2017-12-09 DIAGNOSIS — N132 Hydronephrosis with renal and ureteral calculous obstruction: Secondary | ICD-10-CM

## 2017-12-12 ENCOUNTER — Encounter: Payer: Self-pay | Admitting: Radiology

## 2017-12-19 ENCOUNTER — Telehealth: Payer: Self-pay | Admitting: Urology

## 2017-12-19 NOTE — Telephone Encounter (Signed)
Can you take a look at this RUS report and see Laurie Dickson's note below? Please advise one what we need to do, she has a follow up app with Carollee HerterShannon on 12-22-17 tha t I need to reschd.  Marcelino DusterMichelle

## 2017-12-19 NOTE — Telephone Encounter (Signed)
It looks like the left ureteral stone is still present and her kidney has hydronephrosis.  It may be best if she is scheduled for URS as she has a stone that is entrapped in her ureteral wall.  We need to check with Dr. Apolinar JunesBrandon as I know [name] was not wanted to have URS.

## 2017-12-19 NOTE — Telephone Encounter (Signed)
She needs to be seen by me to discuss ureteroscopy.  Can you work her in tomorrow afternoon please (late afternoon Tuesday).    Vanna ScotlandAshley Aaren Krog, MD

## 2017-12-19 NOTE — Telephone Encounter (Signed)
I have to cx her app for results on 12-22-17 can you call her with results?  Marcelino DusterMichelle

## 2017-12-20 NOTE — Telephone Encounter (Signed)
Patient did have persistent left hydronephrosis on a renal ultrasound but Stone over the weekend.  She did bring to the office today.  We will plan to repeat her renal ultrasound in 1 month and have a follow-up with Carollee HerterShannon.  No plans for surgical intervention at this time.  Vanna ScotlandAshley Aleila Syverson, MD

## 2017-12-20 NOTE — Addendum Note (Signed)
Addended by: Vanna ScotlandBRANDON, Waylin Dorko on: 12/20/2017 04:17 PM   Modules accepted: Orders

## 2017-12-20 NOTE — Telephone Encounter (Signed)
Patient stated that she passed a 7 mm stone and will bring it in for us to look at.    Laurie Dickson

## 2017-12-22 ENCOUNTER — Ambulatory Visit: Payer: Managed Care, Other (non HMO) | Admitting: Urology

## 2018-01-19 ENCOUNTER — Ambulatory Visit
Admission: RE | Admit: 2018-01-19 | Discharge: 2018-01-19 | Disposition: A | Discharge status: Home / Self Care | Payer: Managed Care, Other (non HMO) | Source: Ambulatory Visit | Attending: Urology | Admitting: Urology

## 2018-01-19 DIAGNOSIS — N132 Hydronephrosis with renal and ureteral calculous obstruction: Secondary | ICD-10-CM | POA: Insufficient documentation

## 2018-01-20 ENCOUNTER — Telehealth: Payer: Self-pay

## 2018-01-20 NOTE — Telephone Encounter (Signed)
-----   Message from Vanna Scotland, MD sent at 01/20/2018  8:00 AM EDT ----- It appears your stone is gone.  The swelling in your kidneys has resolved.  Great news.  Vanna Scotland, MD

## 2018-01-20 NOTE — Telephone Encounter (Signed)
lmom for pt call back.

## 2018-01-20 NOTE — Telephone Encounter (Signed)
Pt notified of Dr Delana Meyer note below. Pt voices understanding.   ----- Message from Vanna Scotland, MD sent at 01/20/2018  8:00 AM EDT ----- It appears your stone is gone.  The swelling in your kidneys has resolved.  Great news.  Vanna Scotland, MD

## 2018-01-26 ENCOUNTER — Ambulatory Visit: Payer: Managed Care, Other (non HMO) | Admitting: Urology

## 2019-03-29 IMAGING — CT CT RENAL STONE PROTOCOL
1 of 2 series · 14 of 32 positions shown, 18 images · non-contrast
Comparison: Multiple exams, including 12/21/2016 and 08/21/2015

CLINICAL DATA: Renal calculi, left lithotripsy in 0099. Prior
ultrasound revealed left hydronephrosis

EXAM:
CT ABDOMEN AND PELVIS WITHOUT CONTRAST
TECHNIQUE: Multidetector CT imaging of the abdomen and pelvis was performed
following the standard protocol without IV contrast.

[Series 2: axial st · axial · 0.77mm/px · z∈[-954,-564]mm · 14 of 90 slices shown, 18 images]
[im 8/90  soft-tissue]
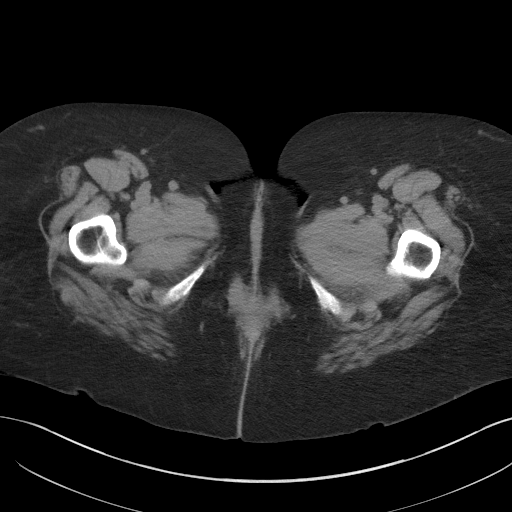
[im 8/90  bone]
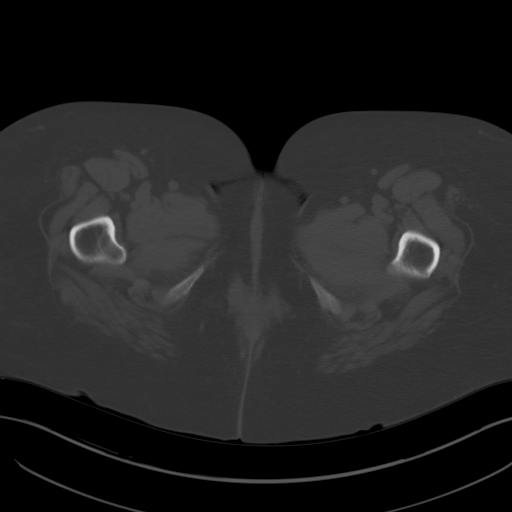
[im 15/90  soft-tissue]
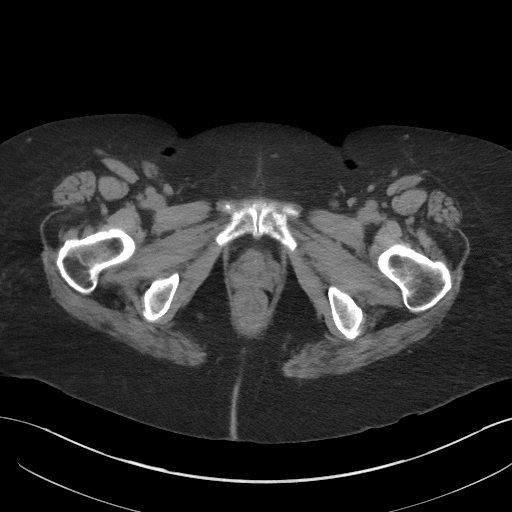
[im 22/90  soft-tissue]
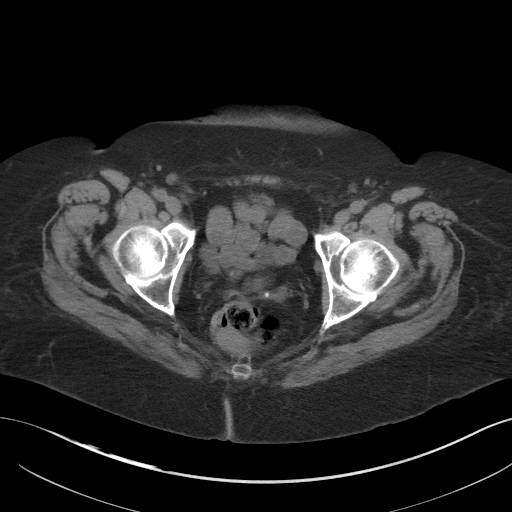
[im 29/90  soft-tissue]
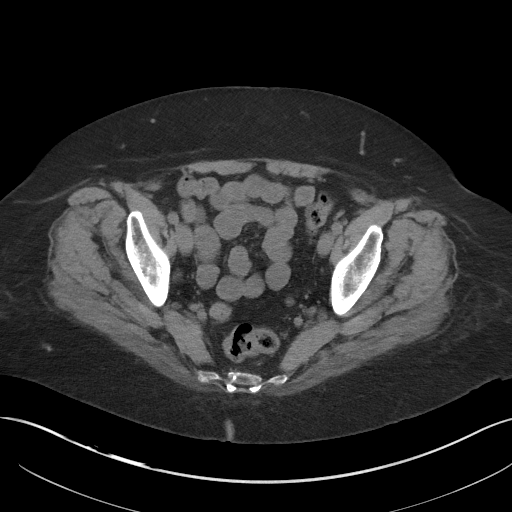
[im 36/90  soft-tissue]
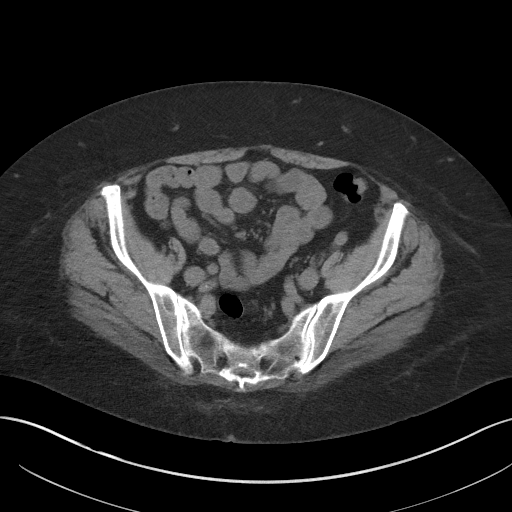
[im 43/90  soft-tissue]
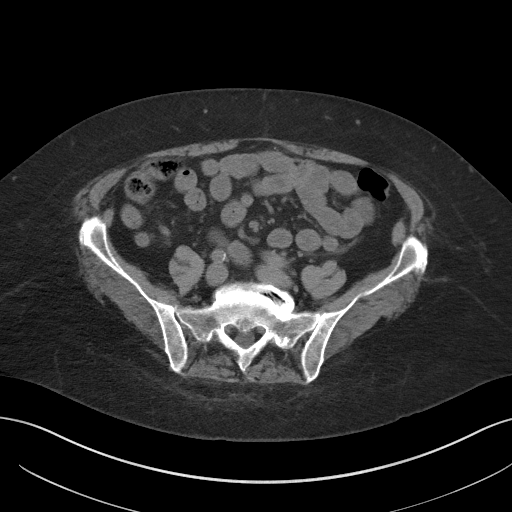
[im 50/90  soft-tissue]
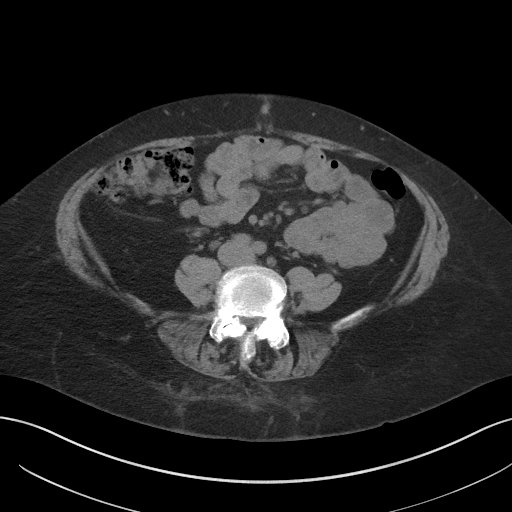
[im 57/90  soft-tissue]
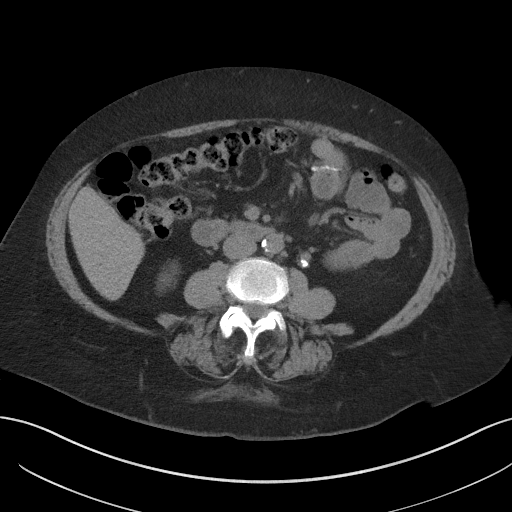
[im 65/90  soft-tissue]
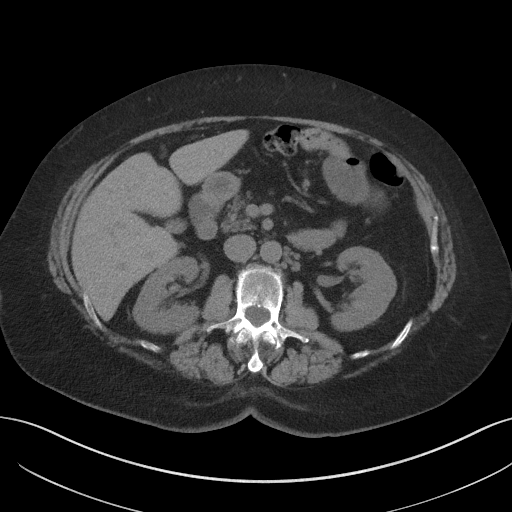
[im 65/90  bone]
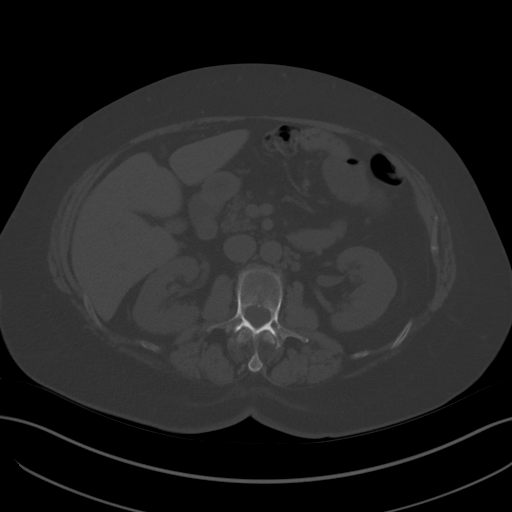
[im 72/90  soft-tissue]
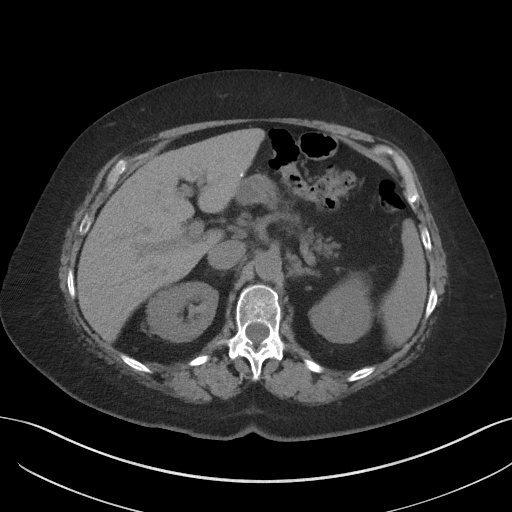
[im 75/90  lung]
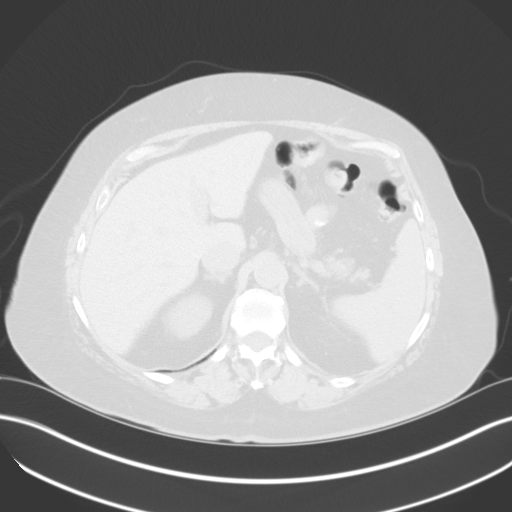
[im 79/90  soft-tissue]
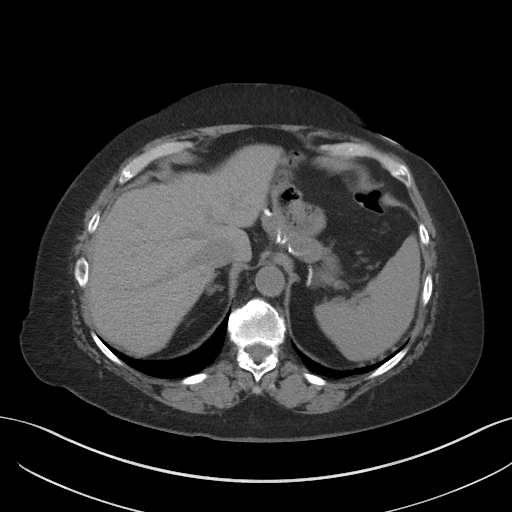
[im 79/90  lung]
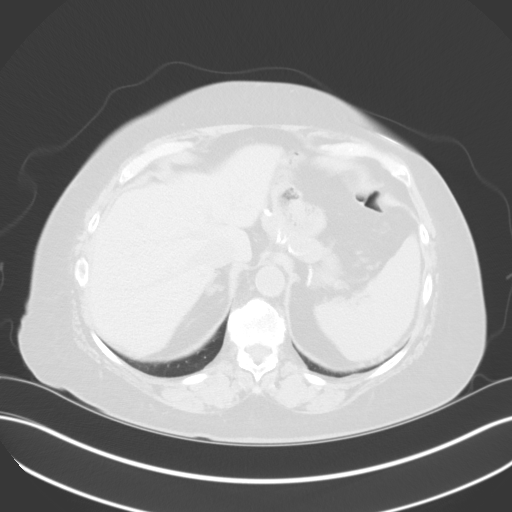
[im 82/90  lung]
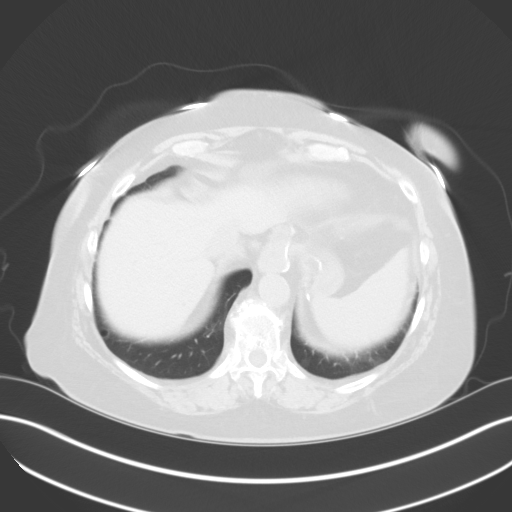
[im 86/90  soft-tissue]
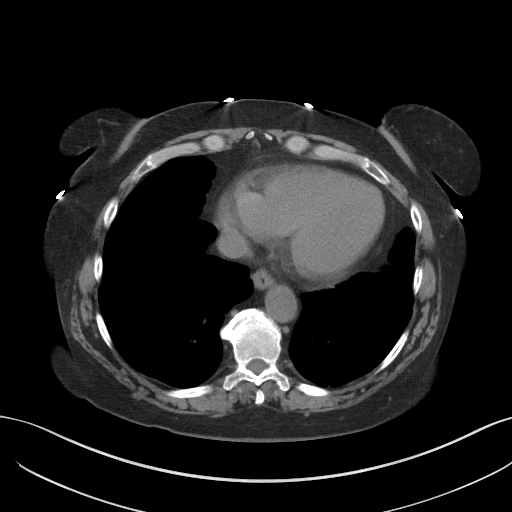
[im 86/90  lung]
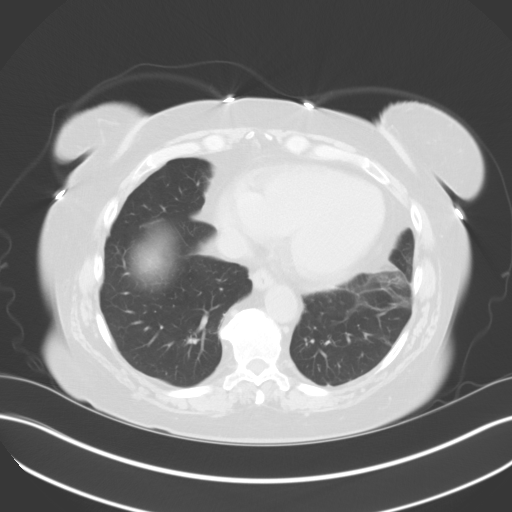

[14 of 32 positions shown; findings below may reference images not displayed]

FINDINGS: Lower chest: Mild scarring or subsegmental atelectasis anteriorly in
the left lower lobe. Mild cardiomegaly.

Hepatobiliary: Dependent density in the gallbladder compatible with
sludge or small gallstones.

Pancreas: Unremarkable

Spleen: Unremarkable

Adrenals/Urinary Tract: Adrenal glands normal. Right kidney lower
pole 9 mm in long axis renal calculus. 1-2 mm right mid kidney
nonobstructive renal calculus.

There is minimal fullness of the left collecting system (no overt
hydronephrosis) associated with a 0.8 by 0.6 by 0.5 cm stone in the
left proximal ureter as shown on image 34/2 and image 54/3. This
stone is not appreciably changed from 08/21/2015 in appearance, and
is slightly eccentrically located laterally in the ureter. The
ureter distal to this stone is normal.

Additional nonobstructive renal calculi include a 8 mm cluster of
calculi in the left mid kidney and a 4 mm calculus in the left
kidney lower pole.

No bladder calculi observed.

Stomach/Bowel: Prior gastric bypass. Mild sigmoid colon
diverticulosis.

Vascular/Lymphatic: Aortoiliac atherosclerotic vascular disease.
Several borderline prominent lymph nodes are present along the
pelvic sidewall and external iliac chains, but these have fatty
hila. For example, a right pelvic sidewall lymph node measures 9 mm
in short axis on image 63/2.

Reproductive: Uterus absent.  Adnexa unremarkable.

Other: In the right lower quadrant on image 46/2 there is a 2.0 cm
faint ring-like density in the adipose tissue probably the result of
prior small omental infarct. This does have a small central Ivic
which can sometimes be seen in epiploic appendagitis, but the
ring-like density is not located near the colon.

Musculoskeletal: Old healed right lower rib fractures. Lumbar
spondylosis and degenerative disc disease with grade 1 degenerative
anterolisthesis at L4-5. This is likely resulting in some degree of
central narrowing of the thecal sac at this level.
IMPRESSION: 1. There is an 8 mm calcification in the left proximal ureter along
the lateral margin of the ureter which is unchanged in position from
08/21/2015. I am uncertain whether this is a calcification in the
wall of the ureter or perhaps an a small ureteral diverticulum. It
does not appear overtly obstructive although there is some very
minimal fullness of the left collecting system. Other nonobstructive
renal calculi are also present. No overt hydronephrosis.
2.  Aortic Atherosclerosis ([id number]).  Mild cardiomegaly.
3. Sludge or gallstones in the gallbladder.
4. Likely small focus of adipose tissue inflammation or small
omental infarct in the right lower quadrant, likely chronic, but not
visible on 08/21/2015.
5. Grade 1 degenerative anterolisthesis at L4-5 with possible
central narrowing of the thecal sac at this level.

## 2019-05-25 ENCOUNTER — Other Ambulatory Visit: Payer: Self-pay

## 2019-05-25 ENCOUNTER — Encounter: Payer: Managed Care, Other (non HMO) | Attending: Internal Medicine | Admitting: Physician Assistant

## 2019-05-25 DIAGNOSIS — Z87891 Personal history of nicotine dependence: Secondary | ICD-10-CM | POA: Diagnosis not present

## 2019-05-25 DIAGNOSIS — E1151 Type 2 diabetes mellitus with diabetic peripheral angiopathy without gangrene: Secondary | ICD-10-CM | POA: Diagnosis not present

## 2019-05-25 DIAGNOSIS — L97822 Non-pressure chronic ulcer of other part of left lower leg with fat layer exposed: Secondary | ICD-10-CM | POA: Diagnosis not present

## 2019-05-25 DIAGNOSIS — L97812 Non-pressure chronic ulcer of other part of right lower leg with fat layer exposed: Secondary | ICD-10-CM | POA: Diagnosis not present

## 2019-05-25 DIAGNOSIS — I890 Lymphedema, not elsewhere classified: Secondary | ICD-10-CM | POA: Diagnosis not present

## 2019-05-25 DIAGNOSIS — E11622 Type 2 diabetes mellitus with other skin ulcer: Secondary | ICD-10-CM | POA: Diagnosis present

## 2019-05-25 NOTE — Progress Notes (Signed)
Laurie Dickson, Laurie Dickson ([id number]) Visit Report for 05/25/2019 Chief Complaint Document Details Patient Name: Laurie Dickson, Laurie Dickson Date of Service: 05/25/2019 1:15 PM Medical Record Number: [id number] Patient Account Number: [id number] Date of Birth/Sex: 09-Dec-1950 (68 y.o. F) Treating RN: Curtis Sites Primary Care Provider: Einar Crow Other Clinician: Referring Provider: Einar Crow Treating Provider/Extender: Linwood Dibbles, Eather Chaires Weeks in Treatment: 0 Information Obtained from: Patient Chief Complaint Bilateral LE Ulcers and lymphedema Electronic Signature(s) Signed: 05/25/2019 1:45:31 PM By: Lenda Kelp PA-C Entered By: Lenda Kelp on 05/25/2019 13:45:31 Laurie Dickson, Laurie Dickson ([id number]) -------------------------------------------------------------------------------- HPI Details Patient Name: Laurie Dickson Date of Service: 05/25/2019 1:15 PM Medical Record Number: [id number] Patient Account Number: [id number] Date of Birth/Sex: [birth year] (68 y.o. F) Treating RN: Curtis Sites Primary Care Provider: Einar Crow Other Clinician: Referring Provider: Einar Crow Treating Provider/Extender: Linwood Dibbles, Elisandra Deshmukh Weeks in Treatment: 0 History of Present Illness HPI Description: 05/25/2019 on evaluation today patient presents for evaluation here in the clinic concerning issues that she has been having for the past 3 weeks with her bilateral lower extremities. She tells me that she has been tolerating utilizing the lymphedema pumps for about 2 hours in the middle the night around 1 AM when she wakes up she typically goes to bed early around 6 wakes up in the middle the night uses her pumps that is only been more recent. Though she feels like it is aggravating her legs to some degree. She is not been using any dressings on her legs at this point. Fortunately there is no signs of active infection either at this time though she has been placed on Keflex I do not see any  evidence of cellulitis I will have her complete that nonetheless. With regard to her treatment she has been prescribed torsemide along with potassium just by her primary care provider recently. She tells me that she has been taking those but was just given them again earlier this week. Obviously that can help some with her edema control. She has not been wearing any compression stockings he tells me that she used to have some but they were too small and she can never get those on. Fortunately there is no signs of active infection at this time locally or systemically. No fever chills noted. She is a former smoker but quit 10 years ago. I do believe that the patient is in require some compression to try to get her edema under control. She tells me that she does sleep in the bed and when she is sitting on the couch or so long she is elevating her legs according to what she tells me. Electronic Signature(s) Signed: 05/25/2019 2:09:24 PM By: Lenda Kelp PA-C Entered By: Lenda Kelp on 05/25/2019 14:09:24 Laurie Dickson, Laurie Dickson ([id number]) -------------------------------------------------------------------------------- Physical Exam Details Patient Name: Laurie Dickson Date of Service: 05/25/2019 1:15 PM Medical Record Number: [id number] Patient Account Number: [id number] Date of Birth/Sex: [birth year] (68 y.o. F) Treating RN: Curtis Sites Primary Care Provider: Einar Crow Other Clinician: Referring Provider: Einar Crow Treating Provider/Extender: Linwood Dibbles, Dorean Daniello Weeks in Treatment: 0 Constitutional sitting or standing blood pressure is within target range for patient.. pulse regular and within target range for patient.Marland Kitchen respirations regular, non-labored and within target range for patient.Marland Kitchen temperature within target range for patient.. Well- nourished and well-hydrated in no acute distress. Eyes conjunctiva clear no eyelid edema noted. pupils equal round and reactive to light  and accommodation. Ears, Nose, Mouth, and Throat no gross abnormality of ear auricles or  external auditory canals. normal hearing noted during conversation. mucus membranes moist. Respiratory normal breathing without difficulty. clear to auscultation bilaterally. Cardiovascular regular rate and rhythm with normal S1, S2. 2+ dorsalis pedis/posterior tibialis pulses. no clubbing, cyanosis, significant edema, <3 sec cap refill. Gastrointestinal (GI) soft, non-tender, non-distended, +BS. no ventral hernia noted. Musculoskeletal normal gait and posture. no significant deformity or arthritic changes, no loss or range of motion, no clubbing. Psychiatric this patient is able to make decisions and demonstrates good insight into disease process. Alert and Oriented x 3. pleasant and cooperative. Notes Upon inspection today patient has several small breakdown area secondary to lymphedema over the bilateral lower extremities mainly in the anterior portion. Fortunately there is no signs of active infection at this time. No fevers, chills, nausea, vomiting, or diarrhea. Patient does have significant stage III lymphedema based on what I am seeing today. She has associated stasis dermatitis noted as well. Electronic Signature(s) Signed: 05/25/2019 2:10:19 PM By: Worthy Keeler PA-C Entered By: Worthy Keeler on 05/25/2019 14:10:17 Laurie Dickson, Laurie Dickson ([id number]) -------------------------------------------------------------------------------- Physician Orders Details Patient Name: Laurie Dickson Date of Service: 05/25/2019 1:15 PM Medical Record Number: [id number] Patient Account Number: [id number] Date of Birth/Sex: 1951/01/09 (68 y.o. F) Treating RN: Montey Hora Primary Care Provider: Frazier Richards Other Clinician: Referring Provider: Frazier Richards Treating Provider/Extender: Melburn Hake, Deajah Erkkila Weeks in Treatment: 0 Verbal / Phone Orders: No Diagnosis Coding ICD-10 Coding Code  Description I89.0 Lymphedema, not elsewhere classified L97.822 Non-pressure chronic ulcer of other part of left lower leg with fat layer exposed L97.812 Non-pressure chronic ulcer of other part of right lower leg with fat layer exposed E11.622 Type 2 diabetes mellitus with other skin ulcer I10 Essential (primary) hypertension Wound Cleansing Wound #1 Left,Anterior Lower Leg o Laurie Dickson shower with protection. - Please do not get your wraps wet. You can get a cast protector from most pharmacies for showers Wound #2 Right,Anterior Lower Leg o Laurie Dickson shower with protection. - Please do not get your wraps wet. You can get a cast protector from most pharmacies for showers Skin Barriers/Peri-Wound Care Wound #1 Left,Anterior Lower Leg o Moisturizing lotion Wound #2 Right,Anterior Lower Leg o Moisturizing lotion Primary Wound Dressing Wound #1 Left,Anterior Lower Leg o Silver Alginate Wound #2 Right,Anterior Lower Leg o Silver Alginate Secondary Dressing Wound #1 Left,Anterior Lower Leg o ABD pad Wound #2 Right,Anterior Lower Leg o ABD pad Dressing Change Frequency Wound #1 Left,Anterior Lower Leg o Change dressing every week o Other: - and as needed AVREY, FLANAGIN. ([id number]) Wound #2 Right,Anterior Lower Leg o Change dressing every week o Other: - and as needed Follow-up Appointments o Return Appointment in 1 week. o Nurse Visit as needed - Monday or Tuesday Edema Control Wound #1 Left,Anterior Lower Leg o Unna Boots Bilaterally o Compression Pump: Use compression pump on left lower extremity for 60 minutes, twice daily. o Compression Pump: Use compression pump on right lower extremity for 60 minutes, twice daily. Wound #2 Right,Anterior Lower Leg o Unna Boots Bilaterally o Compression Pump: Use compression pump on left lower extremity for 60 minutes, twice daily. o Compression Pump: Use compression pump on right lower extremity for 60  minutes, twice daily. Services and Therapies o Ankle Brachial Index (ABI) Electronic Signature(s) Signed: 05/25/2019 4:44:47 PM By: Montey Hora Signed: 05/25/2019 5:02:54 PM By: Worthy Keeler PA-C Entered By: Montey Hora on 05/25/2019 14:07:17 Laurie Dickson ([id number]) -------------------------------------------------------------------------------- Problem List Details Patient Name: Laurie Dickson Date of Service: 05/25/2019 1:15 PM  Medical Record Number: [id number] Patient Account Number: [id number] Date of Birth/Sex: 02/17/1951 (69 y.o. F) Treating RN: Curtis Sites Primary Care Provider: Einar Crow Other Clinician: Referring Provider: Einar Crow Treating Provider/Extender: Linwood Dibbles, Oaklee Sunga Weeks in Treatment: 0 Active Problems ICD-10 Evaluated Encounter Code Description Active Date Today Diagnosis I89.0 Lymphedema, not elsewhere classified 05/25/2019 No Yes L97.822 Non-pressure chronic ulcer of other part of left lower leg with 05/25/2019 No Yes fat layer exposed L97.812 Non-pressure chronic ulcer of other part of right lower leg 05/25/2019 No Yes with fat layer exposed E11.622 Type 2 diabetes mellitus with other skin ulcer 05/25/2019 No Yes I10 Essential (primary) hypertension 05/25/2019 No Yes Inactive Problems Resolved Problems Electronic Signature(s) Signed: 05/25/2019 1:44:59 PM By: Lenda Kelp PA-C Previous Signature: 05/25/2019 1:43:38 PM Version By: Lenda Kelp PA-C Entered By: Lenda Kelp on 05/25/2019 13:44:58 Laurie Dickson ([id number]) -------------------------------------------------------------------------------- Progress Note Details Patient Name: Laurie Dickson Date of Service: 05/25/2019 1:15 PM Medical Record Number: [id number] Patient Account Number: [id number] Date of Birth/Sex: 1951-04-12 (68 y.o. F) Treating RN: Curtis Sites Primary Care Provider: Einar Crow Other Clinician: Referring Provider: Einar Crow Treating Provider/Extender: Linwood Dibbles, Patrica Mendell Weeks in Treatment: 0 Subjective Chief Complaint Information obtained from Patient Bilateral LE Ulcers and lymphedema History of Present Illness (HPI) 05/25/2019 on evaluation today patient presents for evaluation here in the clinic concerning issues that she has been having for the past 3 weeks with her bilateral lower extremities. She tells me that she has been tolerating utilizing the lymphedema pumps for about 2 hours in the middle the night around 1 AM when she wakes up she typically goes to bed early around 6 wakes up in the middle the night uses her pumps that is only been more recent. Though she feels like it is aggravating her legs to some degree. She is not been using any dressings on her legs at this point. Fortunately there is no signs of active infection either at this time though she has been placed on Keflex I do not see any evidence of cellulitis I will have her complete that nonetheless. With regard to her treatment she has been prescribed torsemide along with potassium just by her primary care provider recently. She tells me that she has been taking those but was just given them again earlier this week. Obviously that can help some with her edema control. She has not been wearing any compression stockings he tells me that she used to have some but they were too small and she can never get those on. Fortunately there is no signs of active infection at this time locally or systemically. No fever chills noted. She is a former smoker but quit 10 years ago. I do believe that the patient is in require some compression to try to get her edema under control. She tells me that she does sleep in the bed and when she is sitting on the couch or so long she is elevating her legs according to what she tells me. Patient History Information obtained from Patient. Allergies Sulfa (Sulfonamide Antibiotics) (Severity: Mild) Family  History Heart Disease - Father, Kidney Disease - Father, No family history of Cancer, Diabetes, Hereditary Spherocytosis, Hypertension, Lung Disease, Seizures, Stroke, Thyroid Problems, Tuberculosis. Social History Former smoker - 20 years - ended on 08/30/2008, Marital Status - Married, Alcohol Use - Never, Drug Use - No History, Caffeine Use - Daily - tea. Medical History Eyes Denies history of Cataracts, Glaucoma, Optic Neuritis Ear/Nose/Mouth/Throat Denies history  of Chronic sinus problems/congestion, Middle ear problems Hematologic/Lymphatic Denies history of Anemia, Hemophilia, Human Immunodeficiency Virus, Lymphedema, Sickle Cell Disease Respiratory Denies history of Aspiration, Asthma, Chronic Obstructive Pulmonary Disease (COPD), Pneumothorax, Sleep Apnea, Tuberculosis Laurie Dickson, Laurie S. ([id number]) Cardiovascular Patient has history of Hypertension Denies history of Angina, Arrhythmia, Congestive Heart Failure, Coronary Artery Disease, Deep Vein Thrombosis, Hypotension, Myocardial Infarction, Peripheral Arterial Disease, Peripheral Venous Disease, Phlebitis, Vasculitis Gastrointestinal Denies history of Cirrhosis , Colitis, Crohn s, Hepatitis A, Hepatitis B, Hepatitis C Endocrine Patient has history of Type II Diabetes - past, not anymore Genitourinary Denies history of End Stage Renal Disease Immunological Denies history of Lupus Erythematosus, Raynaud s, Scleroderma Integumentary (Skin) Denies history of History of Burn, History of pressure wounds Musculoskeletal Denies history of Gout, Rheumatoid Arthritis, Osteoarthritis, Osteomyelitis Neurologic Denies history of Dementia, Neuropathy, Quadriplegia, Paraplegia, Seizure Disorder Oncologic Denies history of Received Chemotherapy, Received Radiation Psychiatric Denies history of Anorexia/bulimia, Confinement Anxiety Review of Systems (ROS) Constitutional Symptoms (General Health) Denies complaints or symptoms of  Fatigue, Fever, Chills, Marked Weight Change. Eyes Denies complaints or symptoms of Dry Eyes, Vision Changes, Glasses / Contacts. Ear/Nose/Mouth/Throat Denies complaints or symptoms of Difficult clearing ears, Sinusitis. Hematologic/Lymphatic Denies complaints or symptoms of Bleeding / Clotting Disorders, Human Immunodeficiency Virus. Respiratory Complains or has symptoms of Shortness of Breath - due to overweight. Cardiovascular Complains or has symptoms of LE edema - bilateral. Gastrointestinal Denies complaints or symptoms of Frequent diarrhea, Nausea, Vomiting. Endocrine Denies complaints or symptoms of Hepatitis, Thyroid disease, Polydypsia (Excessive Thirst). Genitourinary Denies complaints or symptoms of Kidney failure/ Dialysis, Incontinence/dribbling. Immunological Denies complaints or symptoms of Hives, Itching. Integumentary (Skin) Complains or has symptoms of Swelling - lymphadema-bilateral. Denies complaints or symptoms of Wounds, Bleeding or bruising tendency, Breakdown. Musculoskeletal Denies complaints or symptoms of Muscle Pain, Muscle Weakness. Neurologic Denies complaints or symptoms of Numbness/parasthesias, Focal/Weakness. Psychiatric Denies complaints or symptoms of Anxiety, Claustrophobia. Laurie Dickson, Laurie Dickson ([id number]) Objective Constitutional sitting or standing blood pressure is within target range for patient.. pulse regular and within target range for patient.Marland Kitchen respirations regular, non-labored and within target range for patient.Marland Kitchen temperature within target range for patient.. Well- nourished and well-hydrated in no acute distress. Vitals Time Taken: 1:14 PM, Height: 64 in, Source: Stated, Weight: 280 lbs, Source: Stated, BMI: 48.1, Temperature: 98.9 F, Pulse: 65 bpm, Respiratory Rate: 16 breaths/min, Blood Pressure: 118/70 mmHg. Eyes conjunctiva clear no eyelid edema noted. pupils equal round and reactive to light and accommodation. Ears, Nose,  Mouth, and Throat no gross abnormality of ear auricles or external auditory canals. normal hearing noted during conversation. mucus membranes moist. Respiratory normal breathing without difficulty. clear to auscultation bilaterally. Cardiovascular regular rate and rhythm with normal S1, S2. 2+ dorsalis pedis/posterior tibialis pulses. no clubbing, cyanosis, significant edema, Gastrointestinal (GI) soft, non-tender, non-distended, +BS. no ventral hernia noted. Musculoskeletal normal gait and posture. no significant deformity or arthritic changes, no loss or range of motion, no clubbing. Psychiatric this patient is able to make decisions and demonstrates good insight into disease process. Alert and Oriented x 3. pleasant and cooperative. General Notes: Upon inspection today patient has several small breakdown area secondary to lymphedema over the bilateral lower extremities mainly in the anterior portion. Fortunately there is no signs of active infection at this time. No fevers, chills, nausea, vomiting, or diarrhea. Patient does have significant stage III lymphedema based on what I am seeing today. She has associated stasis dermatitis noted as well. Integumentary (Hair, Skin) Wound #1 status is Open. Original cause of  wound was Gradually Appeared. The wound is located on the Left,Anterior Lower Leg. The wound measures 3cm length x 4cm width x 0.1cm depth; 9.425cm^2 area and 0.942cm^3 volume. The wound is limited to skin breakdown. There is no tunneling or undermining noted. There is a large amount of serous drainage noted. The wound margin is flat and intact. There is no granulation within the wound bed. There is no necrotic tissue within the wound bed. Wound #2 status is Open. Original cause of wound was Gradually Appeared. The wound is located on the Right,Anterior Lower Leg. The wound measures 11cm length x 8cm width x 0.1cm depth; 69.115cm^2 area and 6.912cm^3 volume. The wound is  limited to skin breakdown. There is no tunneling or undermining noted. There is a large amount of serous drainage noted. The wound margin is flat and intact. There is no granulation within the wound bed. There is no necrotic tissue within the wound bed. Laurie MuttonBALDWIN, Laurie S. ([id number]) Assessment Active Problems ICD-10 Lymphedema, not elsewhere classified Non-pressure chronic ulcer of other part of left lower leg with fat layer exposed Non-pressure chronic ulcer of other part of right lower leg with fat layer exposed Type 2 diabetes mellitus with other skin ulcer Essential (primary) hypertension Procedures Wound #1 Pre-procedure diagnosis of Wound #1 is a Lymphedema located on the Left,Anterior Lower Leg . There was a Three Layer Compression Therapy Procedure by Curtis Sitesorthy, Joanna, RN. Post procedure Diagnosis Wound #1: Same as Pre-Procedure Notes: ABI [place] Bilateral >220 but [facility] referral ordered for ABIs and consult if needed. Wound #2 Pre-procedure diagnosis of Wound #2 is a Lymphedema located on the Right,Anterior Lower Leg . There was a Three Layer Compression Therapy Procedure by Curtis Sitesorthy, Joanna, RN. Post procedure Diagnosis Wound #2: Same as Pre-Procedure Notes: ABI [place] Bilateral >220 but [facility] referral ordered for ABIs and consult if needed. Plan Wound Cleansing: Wound #1 Left,Anterior Lower Leg: Laurie Dickson shower with protection. - Please do not get your wraps wet. You can get a cast protector from most pharmacies for showers Wound #2 Right,Anterior Lower Leg: Laurie Dickson shower with protection. - Please do not get your wraps wet. You can get a cast protector from most pharmacies for showers Skin Barriers/Peri-Wound Care: Wound #1 Left,Anterior Lower Leg: Moisturizing lotion Wound #2 Right,Anterior Lower Leg: Moisturizing lotion Primary Wound Dressing: Wound #1 Left,Anterior Lower Leg: Silver Alginate Wound #2 Right,Anterior Lower Leg: Silver Alginate Secondary Dressing: Wound #1  Left,Anterior Lower Leg: ABD pad Wound #2 Right,Anterior Lower Leg: ABD pad Laurie Dickson, Laurie Kathie RhodesS. ([id number]) Dressing Change Frequency: Wound #1 Left,Anterior Lower Leg: Change dressing every week Other: - and as needed Wound #2 Right,Anterior Lower Leg: Change dressing every week Other: - and as needed Follow-up Appointments: Return Appointment in 1 week. Nurse Visit as needed - Monday or Tuesday Edema Control: Wound #1 Left,Anterior Lower Leg: Unna Boots Bilaterally Compression Pump: Use compression pump on left lower extremity for 60 minutes, twice daily. Compression Pump: Use compression pump on right lower extremity for 60 minutes, twice daily. Wound #2 Right,Anterior Lower Leg: Unna Boots Bilaterally Compression Pump: Use compression pump on left lower extremity for 60 minutes, twice daily. Compression Pump: Use compression pump on right lower extremity for 60 minutes, twice daily. Services and Therapies ordered were: Ankle Brachial Index (ABI) 1. I would recommend currently that we go ahead and initiate treatment with Unna boot wraps bilaterally. This will help with the edema control which will be good for the patient. 2. I am also going to suggest that we  go ahead and begin using a silver alginate dressing to help dry out the wound beds at this point. 3. I am also going to suggest that we go ahead and have her use her lymphedema pumps really for 60 minutes 2 times a day would be ideal. This was discussed with the patient. 4. I am going to go ahead and send her for arterial studies. Depending on the results of the arterial studies we Laurie Dickson end up going ahead and getting her into a 3 layer or even 4 layer compression wrap to try to get more the edema down to put on how things are progressing. We will see patient back for reevaluation in 1 week here in the clinic. If anything worsens or changes patient will contact our office for additional recommendations. Electronic  Signature(s) Signed: 05/25/2019 2:18:43 PM By: Lenda Kelp PA-C Entered By: Lenda Kelp on 05/25/2019 14:18:43 Laurie Dickson, Laurie Dickson ([id number]) -------------------------------------------------------------------------------- ROS/PFSH Details Patient Name: Laurie Dickson Date of Service: 05/25/2019 1:15 PM Medical Record Number: [id number] Patient Account Number: [id number] Date of Birth/Sex: Apr 20, 1951 (68 y.o. F) Treating RN: Arnette Norris Primary Care Provider: Einar Crow Other Clinician: Referring Provider: Einar Crow Treating Provider/Extender: Linwood Dibbles, Birdie Fetty Weeks in Treatment: 0 Information Obtained From Patient Constitutional Symptoms (General Health) Complaints and Symptoms: Negative for: Fatigue; Fever; Chills; Marked Weight Change Eyes Complaints and Symptoms: Negative for: Dry Eyes; Vision Changes; Glasses / Contacts Medical History: Negative for: Cataracts; Glaucoma; Optic Neuritis Ear/Nose/Mouth/Throat Complaints and Symptoms: Negative for: Difficult clearing ears; Sinusitis Medical History: Negative for: Chronic sinus problems/congestion; Middle ear problems Hematologic/Lymphatic Complaints and Symptoms: Negative for: Bleeding / Clotting Disorders; Human Immunodeficiency Virus Medical History: Negative for: Anemia; Hemophilia; Human Immunodeficiency Virus; Lymphedema; Sickle Cell Disease Respiratory Complaints and Symptoms: Positive for: Shortness of Breath - due to overweight Medical History: Negative for: Aspiration; Asthma; Chronic Obstructive Pulmonary Disease (COPD); Pneumothorax; Sleep Apnea; Tuberculosis Cardiovascular Complaints and Symptoms: Positive for: LE edema - bilateral Medical History: Positive for: Hypertension Negative for: Angina; Arrhythmia; Congestive Heart Failure; Coronary Artery Disease; Deep Vein Thrombosis; Hypotension; Myocardial Infarction; Peripheral Arterial Disease; Peripheral Venous Disease; Phlebitis;  Vasculitis Rehfeld, Jessenya S. ([id number]) Gastrointestinal Complaints and Symptoms: Negative for: Frequent diarrhea; Nausea; Vomiting Medical History: Negative for: Cirrhosis ; Colitis; Crohnos; Hepatitis A; Hepatitis B; Hepatitis C Endocrine Complaints and Symptoms: Negative for: Hepatitis; Thyroid disease; Polydypsia (Excessive Thirst) Medical History: Positive for: Type II Diabetes - past, not anymore Genitourinary Complaints and Symptoms: Negative for: Kidney failure/ Dialysis; Incontinence/dribbling Medical History: Negative for: End Stage Renal Disease Immunological Complaints and Symptoms: Negative for: Hives; Itching Medical History: Negative for: Lupus Erythematosus; Raynaudos; Scleroderma Integumentary (Skin) Complaints and Symptoms: Positive for: Swelling - lymphadema-bilateral Negative for: Wounds; Bleeding or bruising tendency; Breakdown Medical History: Negative for: History of Burn; History of pressure wounds Musculoskeletal Complaints and Symptoms: Negative for: Muscle Pain; Muscle Weakness Medical History: Negative for: Gout; Rheumatoid Arthritis; Osteoarthritis; Osteomyelitis Neurologic Complaints and Symptoms: Negative for: Numbness/parasthesias; Focal/Weakness Medical History: Negative for: Dementia; Neuropathy; Quadriplegia; Paraplegia; Seizure Disorder Psychiatric MALLOREE, RABOIN ([id number]) Complaints and Symptoms: Negative for: Anxiety; Claustrophobia Medical History: Negative for: Anorexia/bulimia; Confinement Anxiety Oncologic Medical History: Negative for: Received Chemotherapy; Received Radiation Immunizations Pneumococcal Vaccine: Received Pneumococcal Vaccination: No Implantable Devices None Family and Social History Cancer: No; Diabetes: No; Heart Disease: Yes - Father; Hereditary Spherocytosis: No; Hypertension: No; Kidney Disease: Yes - Father; Lung Disease: No; Seizures: No; Stroke: No; Thyroid Problems: No; Tuberculosis: No;  Former smoker - 20 years - ended on  08/30/2008; Marital Status - Married; Alcohol Use: Never; Drug Use: No History; Caffeine Use: Daily - tea; Financial Concerns: No; Food, Clothing or Shelter Needs: No; Support System Lacking: No; Transportation Concerns: No Electronic Signature(s) Signed: 05/25/2019 4:30:52 PM By: Arnette Norris Signed: 05/25/2019 5:02:54 PM By: Lenda Kelp PA-C Entered By: Arnette Norris on 05/25/2019 13:25:39 JAZLYNN, NEMETZ ([id number]) -------------------------------------------------------------------------------- SuperBill Details Patient Name: Laurie Dickson Date of Service: 05/25/2019 Medical Record Number: [id number] Patient Account Number: [id number] Date of Birth/Sex: 03/18/1951 (68 y.o. F) Treating RN: Curtis Sites Primary Care Provider: Einar Crow Other Clinician: Referring Provider: Einar Crow Treating Provider/Extender: Linwood Dibbles, Tayonna Bacha Weeks in Treatment: 0 Diagnosis Coding ICD-10 Codes Code Description I89.0 Lymphedema, not elsewhere classified L97.822 Non-pressure chronic ulcer of other part of left lower leg with fat layer exposed L97.812 Non-pressure chronic ulcer of other part of right lower leg with fat layer exposed E11.622 Type 2 diabetes mellitus with other skin ulcer I10 Essential (primary) hypertension Facility Procedures CPT4 Code: 88416606 Description: 99213 - WOUND CARE VISIT-LEV 3 EST PT Modifier: Quantity: 1 CPT4 Code: 30160109 Description: 29580 - APPLY UNNA BOOT/PROFO BILATERAL Modifier: Quantity: 1 Physician Procedures CPT4 Code Description: 3235573 22025 - WC PHYS LEVEL 4 - EST PT ICD-10 Diagnosis Description I89.0 Lymphedema, not elsewhere classified L97.822 Non-pressure chronic ulcer of other part of left lower leg wit L97.812 Non-pressure chronic ulcer of other  part of right lower leg wi E11.622 Type 2 diabetes mellitus with other skin ulcer Modifier: h fat layer expos th fat layer expo Quantity: 1  ed sed Electronic Signature(s) Signed: 05/25/2019 2:18:57 PM By: Lenda Kelp PA-C Entered By: Lenda Kelp on 05/25/2019 14:18:57

## 2019-05-25 NOTE — Progress Notes (Signed)
GIULIA, HICKEY ([id number]) Visit Report for 05/25/2019 Abuse/Suicide Risk Screen Details Patient Name: Laurie Dickson, Laurie Dickson Date of Service: 05/25/2019 1:15 PM Medical Record Number: [id number] Patient Account Number: [id number] Date of Birth/Sex: 09/18/1950 (68 y.o. F) Treating RN: Arnette Norris Primary Care Stacia Feazell: Einar Crow Other Clinician: Referring Remas Sobel: Einar Crow Treating Swetha Rayle/Extender: Linwood Dibbles, HOYT Weeks in Treatment: 0 Abuse/Suicide Risk Screen Items Answer ABUSE RISK SCREEN: Has anyone close to you tried to hurt or harm you recentlyo No Do you feel uncomfortable with anyone in your familyo No Has anyone forced you do things that you didnot want to doo No Electronic Signature(s) Signed: 05/25/2019 4:30:52 PM By: Arnette Norris Entered By: Arnette Norris on 05/25/2019 13:25:47 Laurie Dickson ([id number]) -------------------------------------------------------------------------------- Activities of Daily Living Details Patient Name: Laurie Dickson Date of Service: 05/25/2019 1:15 PM Medical Record Number: [id number] Patient Account Number: [id number] Date of Birth/Sex: [birth year] (68 y.o. F) Treating RN: Arnette Norris Primary Care Kavian Peters: Einar Crow Other Clinician: Referring Kayda Allers: Einar Crow Treating Printice Hellmer/Extender: Linwood Dibbles, HOYT Weeks in Treatment: 0 Activities of Daily Living Items Answer Activities of Daily Living (Please select one for each item) Drive Automobile Completely Able Take Medications Completely Able Use Telephone Completely Able Care for Appearance Completely Able Use Toilet Completely Able Bath / Shower Completely Able Dress Self Completely Able Feed Self Completely Able Walk Completely Able Get In / Out Bed Completely Able Housework Completely Able Prepare Meals Completely Able Handle Money Completely Able Shop for Self Completely Able Electronic Signature(s) Signed: 05/25/2019  4:30:52 PM By: Arnette Norris Entered By: Arnette Norris on 05/25/2019 13:25:58 Laurie Dickson ([id number]) -------------------------------------------------------------------------------- Education Screening Details Patient Name: Laurie Dickson Date of Service: 05/25/2019 1:15 PM Medical Record Number: [id number] Patient Account Number: [id number] Date of Birth/Sex: 1951/05/02 (68 y.o. F) Treating RN: Arnette Norris Primary Care Neela Zecca: Einar Crow Other Clinician: Referring Sole Lengacher: Einar Crow Treating Rubens Cranston/Extender: Linwood Dibbles, HOYT Weeks in Treatment: 0 Primary Learner Assessed: Patient Learning Preferences/Education Level/Primary Language Learning Preference: Explanation Highest Education Level: College or Above Preferred Language: English Cognitive Barrier Language Barrier: No Translator Needed: No Memory Deficit: No Emotional Barrier: No Cultural/Religious Beliefs Affecting Medical Care: No Physical Barrier Impaired Vision: No Impaired Hearing: No Decreased Hand dexterity: No Knowledge/Comprehension Knowledge Level: High Comprehension Level: High Ability to understand written High instructions: Ability to understand verbal High instructions: Motivation Anxiety Level: Calm Cooperation: Cooperative Education Importance: Acknowledges Need Interest in Health Problems: Asks Questions Perception: Coherent Willingness to Engage in Self- High Management Activities: Readiness to Engage in Self- High Management Activities: Electronic Signature(s) Signed: 05/25/2019 4:30:52 PM By: Arnette Norris Entered By: Arnette Norris on 05/25/2019 13:26:21 Laurie Dickson, Laurie Dickson ([id number]) -------------------------------------------------------------------------------- Fall Risk Assessment Details Patient Name: Laurie Dickson Date of Service: 05/25/2019 1:15 PM Medical Record Number: [id number] Patient Account Number: [id number] Date of Birth/Sex:  [birth year] (68 y.o. F) Treating RN: Arnette Norris Primary Care Aerion Bagdasarian: Einar Crow Other Clinician: Referring Camdan Burdi: Einar Crow Treating Kacper Cartlidge/Extender: Linwood Dibbles, HOYT Weeks in Treatment: 0 Fall Risk Assessment Items Have you had 2 or more falls in the last 12 monthso 0 No Have you had any fall that resulted in injury in the last 12 monthso 0 No FALLS RISK SCREEN History of falling - immediate or within 3 months 25 Yes Secondary diagnosis (Do you have 2 or more medical diagnoseso) 0 No Ambulatory aid None/bed rest/wheelchair/nurse 0 No Crutches/cane/walker 0 No Furniture 0 No Intravenous therapy Access/Saline/Heparin Lock 0 No Gait/Transferring Normal/ bed rest/ wheelchair 0 No Weak (  short steps with or without shuffle, stooped but able to lift head while 0 No walking, may seek support from furniture) Impaired (short steps with shuffle, may have difficulty arising from chair, head 0 No down, impaired balance) Mental Status Oriented to own ability 0 Yes Electronic Signature(s) Signed: 05/25/2019 4:30:52 PM By: Harold Barban Entered By: Harold Barban on 05/25/2019 13:26:44 Laurie Dickson ([id number]) -------------------------------------------------------------------------------- Foot Assessment Details Patient Name: Laurie Dickson Date of Service: 05/25/2019 1:15 PM Medical Record Number: [id number] Patient Account Number: [id number] Date of Birth/Sex: 1951-03-31 (68 y.o. F) Treating RN: Harold Barban Primary Care Nayra Coury: Frazier Richards Other Clinician: Referring Vala Raffo: Frazier Richards Treating Gautam Langhorst/Extender: Melburn Hake, HOYT Weeks in Treatment: 0 Foot Assessment Items Site Locations + = Sensation present, - = Sensation absent, C = Callus, U = Ulcer R = Redness, W = Warmth, M = Maceration, PU = Pre-ulcerative lesion F = Fissure, S = Swelling, D = Dryness Assessment Right: Left: Other Deformity: No No Prior Foot Ulcer: No  No Prior Amputation: No No Charcot Joint: No No Ambulatory Status: Ambulatory Without Help Gait: Steady Electronic Signature(s) Signed: 05/25/2019 4:30:52 PM By: Harold Barban Entered By: Harold Barban on 05/25/2019 13:30:33 Laurie Dickson ([id number]) -------------------------------------------------------------------------------- Nutrition Risk Screening Details Patient Name: Laurie Dickson Date of Service: 05/25/2019 1:15 PM Medical Record Number: [id number] Patient Account Number: [id number] Date of Birth/Sex: 03-29-1951 (68 y.o. F) Treating RN: Harold Barban Primary Care Jalisa Sacco: Frazier Richards Other Clinician: Referring Yaiden Yang: Frazier Richards Treating Nahiem Dredge/Extender: Melburn Hake, HOYT Weeks in Treatment: 0 Height (in): 64 Weight (lbs): 280 Body Mass Index (BMI): 48.1 Nutrition Risk Screening Items Score Screening NUTRITION RISK SCREEN: I have an illness or condition that made me change the kind and/or amount of 0 No food I eat I eat fewer than two meals per day 0 No I eat few fruits and vegetables, or milk products 0 No I have three or more drinks of beer, liquor or wine almost every day 0 No I have tooth or mouth problems that make it hard for me to eat 0 No I don't always have enough money to buy the food I need 0 No I eat alone most of the time 0 No I take three or more different prescribed or over-the-counter drugs a day 1 Yes Without wanting to, I have lost or gained 10 pounds in the last six months 0 No I am not always physically able to shop, cook and/or feed myself 0 No Nutrition Protocols Good Risk Protocol Moderate Risk Protocol High Risk Proctocol Risk Level: Good Risk Score: 1 Electronic Signature(s) Signed: 05/25/2019 4:30:52 PM By: Harold Barban Entered By: Harold Barban on 05/25/2019 13:27:29

## 2019-05-25 NOTE — Progress Notes (Signed)
Laurie Dickson, Laurie S. ([id number]) Visit Report for 05/25/2019 Allergy List Details Patient Name: Laurie Dickson, Laurie S. Date of Service: 05/25/2019 1:15 PM Medical Record Number: [id number] Patient Account Number: [id number] Date of Birth/Sex: 08/06/1951 (68 y.o. F) Treating RN: Arnette NorrisBiell, Kristina Primary Care Jasir Rother: Einar CrowAnderson, Marshall Other Clinician: Referring Beva Remund: Einar CrowAnderson, Marshall Treating Kaizlee Carlino/Extender: STONE III, HOYT Weeks in Treatment: 0 Allergies Active Allergies Sulfa (Sulfonamide Antibiotics) Severity: Mild Allergy Notes Electronic Signature(s) Signed: 05/25/2019 4:30:52 PM By: Arnette NorrisBiell, Kristina Entered By: Arnette NorrisBiell, Kristina on 05/25/2019 13:20:44 Laurie Dickson, Laurie S. ([id number]) -------------------------------------------------------------------------------- Arrival Information Details Patient Name: Laurie Dickson, Laurie S. Date of Service: 05/25/2019 1:15 PM Medical Record Number: [id number] Patient Account Number: [id number] Date of Birth/Sex: 06/06/1951 (68 y.o. F) Treating RN: Arnette NorrisBiell, Kristina Primary Care Mariadelcarmen Corella: Einar CrowAnderson, Marshall Other Clinician: Referring Evian Salguero: Einar CrowAnderson, Marshall Treating Gaspard Isbell/Extender: Linwood DibblesSTONE III, HOYT Weeks in Treatment: 0 Visit Information Patient Arrived: Ambulatory Arrival Time: 13:13 Accompanied By: husband Transfer Assistance: None Patient Identification Verified: Yes Secondary Verification Process Completed: Yes Electronic Signature(s) Signed: 05/25/2019 4:30:52 PM By: Arnette NorrisBiell, Kristina Entered By: Arnette NorrisBiell, Kristina on 05/25/2019 13:14:18 Laurie Dickson, Laurie S. ([id number]) -------------------------------------------------------------------------------- Clinic Level of Care Assessment Details Patient Name: Laurie Dickson, Laurie S. Date of Service: 05/25/2019 1:15 PM Medical Record Number: [id number] Patient Account Number: [id number] Date of Birth/Sex: 11/24/1950 (68 y.o. F) Treating RN: Curtis Sitesorthy, Joanna Primary Care Anayansi Rundquist: Einar CrowAnderson, Marshall Other  Clinician: Referring Eri Mcevers: Einar CrowAnderson, Marshall Treating Berta Denson/Extender: Linwood DibblesSTONE III, HOYT Weeks in Treatment: 0 Clinic Level of Care Assessment Items TOOL 1 Quantity Score []  - Use when EandM and Procedure is performed on INITIAL visit 0 ASSESSMENTS - Nursing Assessment / Reassessment X - General Physical Exam (combine w/ comprehensive assessment (listed just below) when 1 20 performed on new pt. evals) X- 1 25 Comprehensive Assessment (HX, ROS, Risk Assessments, Wounds Hx, etc.) ASSESSMENTS - Wound and Skin Assessment / Reassessment []  - Dermatologic / Skin Assessment (not related to wound area) 0 ASSESSMENTS - Ostomy and/or Continence Assessment and Care []  - Incontinence Assessment and Management 0 []  - 0 Ostomy Care Assessment and Management (repouching, etc.) PROCESS - Coordination of Care X - Simple Patient / Family Education for ongoing care 1 15 []  - 0 Complex (extensive) Patient / Family Education for ongoing care X- 1 10 Staff obtains ChiropractorConsents, Records, Test Results / Process Orders []  - 0 Staff telephones HHA, Nursing Homes / Clarify orders / etc []  - 0 Routine Transfer to another Facility (non-emergent condition) []  - 0 Routine Hospital Admission (non-emergent condition) X- 1 15 New Admissions / Manufacturing engineernsurance Authorizations / Ordering NPWT, Apligraf, etc. []  - 0 Emergency Hospital Admission (emergent condition) PROCESS - Special Needs []  - Pediatric / Minor Patient Management 0 []  - 0 Isolation Patient Management []  - 0 Hearing / Language / Visual special needs []  - 0 Assessment of Community assistance (transportation, D/C planning, etc.) []  - 0 Additional assistance / Altered mentation []  - 0 Support Surface(s) Assessment (bed, cushion, seat, etc.) Laurie Dickson, Laurie S. ([id number]) INTERVENTIONS - Miscellaneous []  - External ear exam 0 []  - 0 Patient Transfer (multiple staff / Nurse, adultHoyer Lift / Similar devices) []  - 0 Simple Staple / Suture removal (25 or  less) []  - 0 Complex Staple / Suture removal (26 or more) []  - 0 Hypo/Hyperglycemic Management (do not check if billed separately) X- 1 15 Ankle / Brachial Index (ABI) - do not check if billed separately Has the patient been seen at the hospital within the last three years: Yes Total Score: 100 Level Of Care: New/Established - Level  3 Electronic Signature(s) Signed: 05/25/2019 4:44:47 PM By: Curtis Sites Entered By: Curtis Sites on 05/25/2019 14:03:02 Laurie Dickson ([id number]) -------------------------------------------------------------------------------- Compression Therapy Details Patient Name: Laurie Dickson Date of Service: 05/25/2019 1:15 PM Medical Record Number: [id number] Patient Account Number: [id number] Date of Birth/Sex: 1951-03-17 (68 y.o. F) Treating RN: Curtis Sites Primary Care Laci Frenkel: Einar Crow Other Clinician: Referring Kage Willmann: Einar Crow Treating Shion Bluestein/Extender: Linwood Dibbles, HOYT Weeks in Treatment: 0 Compression Therapy Performed for Wound Assessment: Wound #1 Left,Anterior Lower Leg Performed By: Clinician Curtis Sites, RN Compression Type: Three Layer Post Procedure Diagnosis Same as Pre-procedure Notes ABI [place] Bilateral >220 but [facility] referral ordered for ABIs and consult if needed Electronic Signature(s) Signed: 05/25/2019 4:44:47 PM By: Curtis Sites Entered By: Curtis Sites on 05/25/2019 13:58:41 Laurie Dickson ([id number]) -------------------------------------------------------------------------------- Compression Therapy Details Patient Name: Laurie Dickson Date of Service: 05/25/2019 1:15 PM Medical Record Number: [id number] Patient Account Number: [id number] Date of Birth/Sex: 1950-09-24 (68 y.o. F) Treating RN: Curtis Sites Primary Care Katheleen Stella: Einar Crow Other Clinician: Referring Kaloni Bisaillon: Einar Crow Treating Amedee Cerrone/Extender: Linwood Dibbles, HOYT Weeks in Treatment: 0 Compression Therapy  Performed for Wound Assessment: Wound #2 Right,Anterior Lower Leg Performed By: Clinician Curtis Sites, RN Compression Type: Three Layer Post Procedure Diagnosis Same as Pre-procedure Notes ABI [place] Bilateral >220 but [facility] referral ordered for ABIs and consult if needed Electronic Signature(s) Signed: 05/25/2019 4:44:47 PM By: Curtis Sites Entered By: Curtis Sites on 05/25/2019 13:58:42 Laurie Dickson ([id number]) -------------------------------------------------------------------------------- Encounter Discharge Information Details Patient Name: Laurie Dickson Date of Service: 05/25/2019 1:15 PM Medical Record Number: [id number] Patient Account Number: [id number] Date of Birth/Sex: 1950-11-10 (68 y.o. F) Treating RN: Curtis Sites Primary Care Taneya Conkel: Einar Crow Other Clinician: Referring Bradford Cazier: Einar Crow Treating Rajan Burgard/Extender: Linwood Dibbles, HOYT Weeks in Treatment: 0 Encounter Discharge Information Items Discharge Condition: Stable Ambulatory Status: Ambulatory Discharge Destination: Home Transportation: Private Auto Accompanied By: spouse Schedule Follow-up Appointment: Yes Clinical Summary of Care: Electronic Signature(s) Signed: 05/25/2019 2:08:25 PM By: Curtis Sites Entered By: Curtis Sites on 05/25/2019 14:08:25 Laurie Dickson ([id number]) -------------------------------------------------------------------------------- Lower Extremity Assessment Details Patient Name: Laurie Dickson Date of Service: 05/25/2019 1:15 PM Medical Record Number: [id number] Patient Account Number: [id number] Date of Birth/Sex: [birth year] (68 y.o. F) Treating RN: Arnette Norris Primary Care Anayah Arvanitis: Einar Crow Other Clinician: Referring Aniza Shor: Einar Crow Treating Arminda Foglio/Extender: Linwood Dibbles, HOYT Weeks in Treatment: 0 Edema Assessment Assessed: [Left: No] [Right: No] [Left: Edema] [Right: :] Calf Left: Right: Point of Measurement:  32 cm From Medial Instep 64 cm 63 cm Ankle Left: Right: Point of Measurement: 9 cm From Medial Instep 41.5 cm 38 cm Vascular Assessment Pulses: Dorsalis Pedis Palpable: [Left:Yes] [Right:Yes] Posterior Tibial Palpable: [Left:No Yes] [Right:No Yes] Electronic Signature(s) Signed: 05/25/2019 4:30:52 PM By: Arnette Norris Entered By: Arnette Norris on 05/25/2019 13:37:48 Boening, Gerilyn Nestle ([id number]) -------------------------------------------------------------------------------- Multi Wound Chart Details Patient Name: Laurie Dickson Date of Service: 05/25/2019 1:15 PM Medical Record Number: [id number] Patient Account Number: [id number] Date of Birth/Sex: 02/13/1951 (68 y.o. F) Treating RN: Curtis Sites Primary Care Danyka Merlin: Einar Crow Other Clinician: Referring Cianna Kasparian: Einar Crow Treating Amyia Lodwick/Extender: Linwood Dibbles, HOYT Weeks in Treatment: 0 Vital Signs Height(in): 64 Pulse(bpm): 65 Weight(lbs): 280 Blood Pressure(mmHg): 118/70 Body Mass Index(BMI): 48 Temperature(F): 98.9 Respiratory Rate 16 (breaths/min): Photos: [N/A:N/A] Wound Location: Left Lower Leg - Anterior Right Lower Leg - Anterior N/A Wounding Event: Gradually Appeared Gradually Appeared N/A Primary Etiology: Lymphedema Lymphedema N/A Comorbid History: Hypertension, Type II Diabetes Hypertension, Type II  Diabetes N/A Date Acquired: 05/04/2019 05/04/2019 N/A Weeks of Treatment: 0 0 N/A Wound Status: Open Open N/A Measurements L x W x D 3x4x0.1 11x8x0.1 N/A (cm) Area (cm) : 9.425 69.115 N/A Volume (cm) : 0.942 6.912 N/A % Reduction in Area: 0.00% 0.00% N/A % Reduction in Volume: 0.00% 0.00% N/A Classification: Partial Thickness Partial Thickness N/A Exudate Amount: Large Large N/A Exudate Type: Serous Serous N/A Exudate Color: amber amber N/A Wound Margin: Flat and Intact Flat and Intact N/A Granulation Amount: None Present (0%) None Present (0%) N/A Necrotic Amount: None Present  (0%) None Present (0%) N/A Exposed Structures: Fascia: No Fascia: No N/A Fat Layer (Subcutaneous Fat Layer (Subcutaneous Tissue) Exposed: No Tissue) Exposed: No Tendon: No Tendon: No Muscle: No Muscle: No Joint: No Joint: No Bone: No Bone: No Limited to Skin Breakdown Limited to Skin Breakdown Epithelialization: None None N/A Laurie Dickson, Laurie Dickson ([id number]) Procedures Performed: Compression Therapy Compression Therapy N/A Treatment Notes Wound #1 (Left, Anterior Lower Leg) Notes silvercel, abd, unna boots bilateral Wound #2 (Right, Anterior Lower Leg) Notes silvercel, abd, unna boots bilateral Electronic Signature(s) Signed: 05/25/2019 3:35:34 PM By: Curtis Sites Entered By: Curtis Sites on 05/25/2019 15:35:34 Laurie Dickson ([id number]) -------------------------------------------------------------------------------- Multi-Disciplinary Care Plan Details Patient Name: Laurie Dickson Date of Service: 05/25/2019 1:15 PM Medical Record Number: [id number] Patient Account Number: [id number] Date of Birth/Sex: 03/28/1951 (68 y.o. F) Treating RN: Curtis Sites Primary Care Eustacio Ellen: Einar Crow Other Clinician: Referring Mikele Sifuentes: Einar Crow Treating Zvi Duplantis/Extender: Linwood Dibbles, HOYT Weeks in Treatment: 0 Active Inactive Abuse / Safety / Falls / Self Care Management Nursing Diagnoses: Potential for falls Goals: Patient will remain injury free related to falls Date Initiated: 05/25/2019 Target Resolution Date: 08/04/2019 Goal Status: Active Interventions: Assess fall risk on admission and as needed Notes: Orientation to the Wound Care Program Nursing Diagnoses: Knowledge deficit related to the wound healing center program Goals: Patient/caregiver will verbalize understanding of the Wound Healing Center Program Date Initiated: 05/25/2019 Target Resolution Date: 08/04/2019 Goal Status: Active Interventions: Provide education on orientation to the  wound center Notes: Venous Leg Ulcer Nursing Diagnoses: Potential for venous Insuffiency (use before diagnosis confirmed) Goals: Patient will maintain optimal edema control Date Initiated: 05/25/2019 Target Resolution Date: 08/04/2019 Goal Status: Active Interventions: Compression as ordered Laurie Dickson, FORONDA ([id number]) Notes: Wound/Skin Impairment Nursing Diagnoses: Impaired tissue integrity Goals: Ulcer/skin breakdown will heal within 14 weeks Date Initiated: 05/25/2019 Target Resolution Date: 08/04/2019 Goal Status: Active Interventions: Assess patient/caregiver ability to obtain necessary supplies Assess patient/caregiver ability to perform ulcer/skin care regimen upon admission and as needed Assess ulceration(s) every visit Notes: Electronic Signature(s) Signed: 05/25/2019 4:44:47 PM By: Curtis Sites Entered By: Curtis Sites on 05/25/2019 13:55:20 Laurie Dickson ([id number]) -------------------------------------------------------------------------------- Pain Assessment Details Patient Name: Laurie Dickson Date of Service: 05/25/2019 1:15 PM Medical Record Number: [id number] Patient Account Number: [id number] Date of Birth/Sex: [birth year] (68 y.o. F) Treating RN: Arnette Norris Primary Care Keyly Baldonado: Einar Crow Other Clinician: Referring Jarrad Mclees: Einar Crow Treating Jabbar Palmero/Extender: Linwood Dibbles, HOYT Weeks in Treatment: 0 Active Problems Location of Pain Severity and Description of Pain Patient Has Paino No Site Locations Pain Management and Medication Current Pain Management: Electronic Signature(s) Signed: 05/25/2019 4:30:52 PM By: Arnette Norris Entered By: Arnette Norris on 05/25/2019 13:14:28 Laurie Dickson ([id number]) -------------------------------------------------------------------------------- Patient/Caregiver Education Details Patient Name: Laurie Dickson Date of Service: 05/25/2019 1:15 PM Medical Record Number:  [id number] Patient Account Number: [id number] Date of Birth/Gender: [birth year] (68 y.o. F) Treating RN: Curtis Sites Primary Care  Physician: Frazier Richards Other Clinician: Referring Physician: Frazier Richards Treating Physician/Extender: Melburn Hake, HOYT Weeks in Treatment: 0 Education Assessment Education Provided To: Patient and Caregiver Education Topics Provided Venous: Handouts: Other: use pumps Methods: Explain/Verbal Responses: State content correctly Electronic Signature(s) Signed: 05/25/2019 4:44:47 PM By: Montey Hora Entered By: Montey Hora on 05/25/2019 14:07:45 Laurie Dickson ([id number]) -------------------------------------------------------------------------------- Wound Assessment Details Patient Name: Laurie Dickson Date of Service: 05/25/2019 1:15 PM Medical Record Number: [id number] Patient Account Number: [id number] Date of Birth/Sex: June 24, 1951 (68 y.o. F) Treating RN: Harold Barban Primary Care Shanon Seawright: Frazier Richards Other Clinician: Referring Wentworth Edelen: Frazier Richards Treating Bindu Docter/Extender: Melburn Hake, HOYT Weeks in Treatment: 0 Wound Status Wound Number: 1 Primary Etiology: Lymphedema Wound Location: Left Lower Leg - Anterior Wound Status: Open Wounding Event: Gradually Appeared Comorbid History: Hypertension, Type II Diabetes Date Acquired: 05/04/2019 Weeks Of Treatment: 0 Clustered Wound: No Photos Wound Measurements Length: (cm) 3 % Reduction i Width: (cm) 4 % Reduction i Depth: (cm) 0.1 Epithelializa Area: (cm) 9.425 Tunneling: Volume: (cm) 0.942 Undermining: n Area: 0% n Volume: 0% tion: None No No Wound Description Classification: Partial Thickness Foul Odor Aft Wound Margin: Flat and Intact Slough/Fibrin Exudate Amount: Large Exudate Type: Serous Exudate Color: amber er Cleansing: No o No Wound Bed Granulation Amount: None Present (0%) Exposed Structure Necrotic Amount: None Present (0%) Fascia  Exposed: No Fat Layer (Subcutaneous Tissue) Exposed: No Tendon Exposed: No Muscle Exposed: No Joint Exposed: No Bone Exposed: No Limited to Skin Breakdown Treatment Notes ZANYAH, LENTSCH ([id number]) Wound #1 (Left, Anterior Lower Leg) Notes silvercel, abd, unna boots bilateral Electronic Signature(s) Signed: 05/25/2019 1:44:04 PM By: Worthy Keeler PA-C Signed: 05/25/2019 4:30:52 PM By: Harold Barban Entered By: Worthy Keeler on 05/25/2019 13:44:04 Laurie Dickson ([id number]) -------------------------------------------------------------------------------- Wound Assessment Details Patient Name: Laurie Dickson Date of Service: 05/25/2019 1:15 PM Medical Record Number: [id number] Patient Account Number: [id number] Date of Birth/Sex: [birth year] (68 y.o. F) Treating RN: Harold Barban Primary Care Darnella Zeiter: Frazier Richards Other Clinician: Referring Mekaylah Klich: Frazier Richards Treating Brandn Mcgath/Extender: Melburn Hake, HOYT Weeks in Treatment: 0 Wound Status Wound Number: 2 Primary Etiology: Lymphedema Wound Location: Right Lower Leg - Anterior Wound Status: Open Wounding Event: Gradually Appeared Comorbid History: Hypertension, Type II Diabetes Date Acquired: 05/04/2019 Weeks Of Treatment: 0 Clustered Wound: No Photos Wound Measurements Length: (cm) 11 % Reduction in Width: (cm) 8 % Reduction in Depth: (cm) 0.1 Epithelializat Area: (cm) 69.115 Tunneling: Volume: (cm) 6.912 Undermining: Area: 0% Volume: 0% ion: None No No Wound Description Classification: Partial Thickness Foul Odor Afte Wound Margin: Flat and Intact Slough/Fibrino Exudate Amount: Large Exudate Type: Serous Exudate Color: amber r Cleansing: No No Wound Bed Granulation Amount: None Present (0%) Exposed Structure Necrotic Amount: None Present (0%) Fascia Exposed: No Fat Layer (Subcutaneous Tissue) Exposed: No Tendon Exposed: No Muscle Exposed: No Joint Exposed: No Bone Exposed:  No Limited to Skin Breakdown Treatment Notes KYLIEE, ORTEGO ([id number]) Wound #2 (Right, Anterior Lower Leg) Notes silvercel, abd, unna boots bilateral Electronic Signature(s) Signed: 05/25/2019 1:44:35 PM By: Worthy Keeler PA-C Signed: 05/25/2019 4:30:52 PM By: Harold Barban Entered By: Worthy Keeler on 05/25/2019 13:44:35 Laurie Dickson ([id number]) -------------------------------------------------------------------------------- Vitals Details Patient Name: Laurie Dickson Date of Service: 05/25/2019 1:15 PM Medical Record Number: [id number] Patient Account Number: [id number] Date of Birth/Sex: 1950/09/30 (68 y.o. F) Treating RN: Harold Barban Primary Care Taniesha Glanz: Frazier Richards Other Clinician: Referring Poet Hineman: Frazier Richards Treating De Libman/Extender: STONE III, HOYT Weeks in Treatment: 0  Vital Signs Time Taken: 13:14 Temperature (F): 98.9 Height (in): 64 Pulse (bpm): 65 Source: Stated Respiratory Rate (breaths/min): 16 Weight (lbs): 280 Blood Pressure (mmHg): 118/70 Source: Stated Reference Range: 80 - 120 mg / dl Body Mass Index (BMI): 48.1 Electronic Signature(s) Signed: 05/25/2019 4:30:52 PM By: Arnette Norris Entered By: Arnette Norris on 05/25/2019 13:17:02

## 2019-05-30 ENCOUNTER — Ambulatory Visit: Payer: Managed Care, Other (non HMO) | Admitting: Internal Medicine

## 2019-05-31 ENCOUNTER — Other Ambulatory Visit: Payer: Self-pay

## 2019-05-31 ENCOUNTER — Other Ambulatory Visit (INDEPENDENT_AMBULATORY_CARE_PROVIDER_SITE_OTHER): Payer: Self-pay | Admitting: Physician Assistant

## 2019-05-31 ENCOUNTER — Encounter: Payer: Managed Care, Other (non HMO) | Attending: Physician Assistant | Admitting: Physician Assistant

## 2019-05-31 DIAGNOSIS — L97812 Non-pressure chronic ulcer of other part of right lower leg with fat layer exposed: Secondary | ICD-10-CM

## 2019-05-31 DIAGNOSIS — Z09 Encounter for follow-up examination after completed treatment for conditions other than malignant neoplasm: Secondary | ICD-10-CM | POA: Insufficient documentation

## 2019-05-31 DIAGNOSIS — I890 Lymphedema, not elsewhere classified: Secondary | ICD-10-CM | POA: Insufficient documentation

## 2019-05-31 DIAGNOSIS — Z8249 Family history of ischemic heart disease and other diseases of the circulatory system: Secondary | ICD-10-CM | POA: Insufficient documentation

## 2019-05-31 DIAGNOSIS — L97821 Non-pressure chronic ulcer of other part of left lower leg limited to breakdown of skin: Secondary | ICD-10-CM | POA: Diagnosis not present

## 2019-05-31 DIAGNOSIS — I10 Essential (primary) hypertension: Secondary | ICD-10-CM | POA: Insufficient documentation

## 2019-05-31 DIAGNOSIS — E11622 Type 2 diabetes mellitus with other skin ulcer: Secondary | ICD-10-CM | POA: Insufficient documentation

## 2019-05-31 DIAGNOSIS — L97811 Non-pressure chronic ulcer of other part of right lower leg limited to breakdown of skin: Secondary | ICD-10-CM | POA: Insufficient documentation

## 2019-05-31 DIAGNOSIS — Z87891 Personal history of nicotine dependence: Secondary | ICD-10-CM | POA: Diagnosis not present

## 2019-05-31 DIAGNOSIS — L97822 Non-pressure chronic ulcer of other part of left lower leg with fat layer exposed: Secondary | ICD-10-CM

## 2019-05-31 NOTE — Progress Notes (Signed)
JUDA, LAJEUNESSE ([id number]) Visit Report for 05/31/2019 Arrival Information Details Patient Name: Laurie Dickson, Laurie Dickson Date of Service: 05/31/2019 10:15 AM Medical Record Number: [id number] Patient Account Number: [id number] Date of Birth/Sex: 04/12/1951 (68 y.o. F) Treating RN: Montey Hora Primary Care Marycarmen Hagey: Frazier Richards Other Clinician: Referring Lundynn Cohoon: Frazier Richards Treating Jarious Lyon/Extender: Melburn Hake, HOYT Weeks in Treatment: 0 Visit Information History Since Last Visit Added or deleted any medications: No Patient Arrived: Ambulatory Any new allergies or adverse reactions: No Arrival Time: 10:19 Had a fall or experienced change in No Accompanied By: self activities of daily living that may affect Transfer Assistance: None risk of falls: Patient Identification Verified: Yes Signs or symptoms of abuse/neglect since last visito No Secondary Verification Process Completed: Yes Hospitalized since last visit: No Implantable device outside of the clinic excluding No cellular tissue based products placed in the center since last visit: Has Dressing in Place as Prescribed: Yes Has Compression in Place as Prescribed: Yes Pain Present Now: No Electronic Signature(Dickson) Signed: 05/31/2019 5:10:54 PM By: Montey Hora Entered By: Montey Hora on 05/31/2019 10:20:31 Laurie Dickson ([id number]) -------------------------------------------------------------------------------- Clinic Level of Care Assessment Details Patient Name: Laurie Dickson Date of Service: 05/31/2019 10:15 AM Medical Record Number: [id number] Patient Account Number: [id number] Date of Birth/Sex: [birth year] (68 y.o. F) Treating RN: Army Melia Primary Care Manali Mcelmurry: Frazier Richards Other Clinician: Referring Gizel Riedlinger: Frazier Richards Treating Danylah Holden/Extender: Melburn Hake, HOYT Weeks in Treatment: 0 Clinic Level of Care Assessment Items TOOL 4 Quantity Score []  - Use when only an EandM is  performed on FOLLOW-UP visit 0 ASSESSMENTS - Nursing Assessment / Reassessment X - Reassessment of Co-morbidities (includes updates in patient status) 1 10 X- 1 5 Reassessment of Adherence to Treatment Plan ASSESSMENTS - Wound and Skin Assessment / Reassessment []  - Simple Wound Assessment / Reassessment - one wound 0 X- 2 5 Complex Wound Assessment / Reassessment - multiple wounds []  - 0 Dermatologic / Skin Assessment (not related to wound area) ASSESSMENTS - Focused Assessment []  - Circumferential Edema Measurements - multi extremities 0 []  - 0 Nutritional Assessment / Counseling / Intervention []  - 0 Lower Extremity Assessment (monofilament, tuning fork, pulses) []  - 0 Peripheral Arterial Disease Assessment (using hand held doppler) ASSESSMENTS - Ostomy and/or Continence Assessment and Care []  - Incontinence Assessment and Management 0 []  - 0 Ostomy Care Assessment and Management (repouching, etc.) PROCESS - Coordination of Care X - Simple Patient / Family Education for ongoing care 1 15 []  - 0 Complex (extensive) Patient / Family Education for ongoing care []  - 0 Staff obtains Programmer, systems, Records, Test Results / Process Orders []  - 0 Staff telephones HHA, Nursing Homes / Clarify orders / etc []  - 0 Routine Transfer to another Facility (non-emergent condition) []  - 0 Routine Hospital Admission (non-emergent condition) []  - 0 New Admissions / Biomedical engineer / Ordering NPWT, Apligraf, etc. []  - 0 Emergency Hospital Admission (emergent condition) X- 1 10 Simple Discharge Coordination AVRIANA, JOO. ([id number]) []  - 0 Complex (extensive) Discharge Coordination PROCESS - Special Needs []  - Pediatric / Minor Patient Management 0 []  - 0 Isolation Patient Management []  - 0 Hearing / Language / Visual special needs []  - 0 Assessment of Community assistance (transportation, D/C planning, etc.) []  - 0 Additional assistance / Altered mentation []  - 0 Support  Surface(Dickson) Assessment (bed, cushion, seat, etc.) INTERVENTIONS - Wound Cleansing / Measurement []  - Simple Wound Cleansing - one wound 0 X- 2 5 Complex Wound Cleansing - multiple wounds  X- 1 5 Wound Imaging (photographs - any number of wounds) []  - 0 Wound Tracing (instead of photographs) []  - 0 Simple Wound Measurement - one wound X- 2 5 Complex Wound Measurement - multiple wounds INTERVENTIONS - Wound Dressings []  - Small Wound Dressing one or multiple wounds 0 X- 2 15 Medium Wound Dressing one or multiple wounds []  - 0 Large Wound Dressing one or multiple wounds []  - 0 Application of Medications - topical []  - 0 Application of Medications - injection INTERVENTIONS - Miscellaneous []  - External ear exam 0 []  - 0 Specimen Collection (cultures, biopsies, blood, body fluids, etc.) []  - 0 Specimen(Dickson) / Culture(Dickson) sent or taken to Lab for analysis []  - 0 Patient Transfer (multiple staff / Nurse, adultHoyer Lift / Similar devices) []  - 0 Simple Staple / Suture removal (25 or less) []  - 0 Complex Staple / Suture removal (26 or more) []  - 0 Hypo / Hyperglycemic Management (close monitor of Blood Glucose) []  - 0 Ankle / Brachial Index (ABI) - do not check if billed separately X- 1 5 Vital Signs Laurie Dickson, Laurie Dickson. ([id number]) Has the patient been seen at the hospital within the last three years: Yes Total Score: 110 Level Of Care: New/Established - Level 3 Electronic Signature(Dickson) Signed: 05/31/2019 5:01:07 PM By: Rodell PernaScott, Dajea Entered By: Rodell PernaScott, Dajea on 05/31/2019 10:46:45 Laurie MuttonBALDWIN, Laurie Dickson. ([id number]) -------------------------------------------------------------------------------- Encounter Discharge Information Details Patient Name: Laurie MuttonBALDWIN, Laurie Dickson. Date of Service: 05/31/2019 10:15 AM Medical Record Number: [id number] Patient Account Number: [id number] Date of Birth/Sex: 10/10/1950 (68 y.o. F) Treating RN: Rodell PernaScott, Dajea Primary Care Lynnmarie Lovett: Einar CrowAnderson, Marshall Other  Clinician: Referring Bilan Tedesco: Einar CrowAnderson, Marshall Treating Yvett Rossel/Extender: Linwood DibblesSTONE III, HOYT Weeks in Treatment: 0 Encounter Discharge Information Items Discharge Condition: Stable Ambulatory Status: Ambulatory Discharge Destination: Home Transportation: Private Auto Accompanied By: self Schedule Follow-up Appointment: Yes Clinical Summary of Care: Electronic Signature(Dickson) Signed: 05/31/2019 5:01:07 PM By: Rodell PernaScott, Dajea Entered By: Rodell PernaScott, Dajea on 05/31/2019 10:48:01 Laurie MuttonBALDWIN, Laurie Dickson. ([id number]) -------------------------------------------------------------------------------- Lower Extremity Assessment Details Patient Name: Laurie MuttonBALDWIN, Laurie Dickson. Date of Service: 05/31/2019 10:15 AM Medical Record Number: [id number] Patient Account Number: [id number] Date of Birth/Sex: 07/10/1951 (68 y.o. F) Treating RN: Curtis Sitesorthy, Joanna Primary Care Anea Fodera: Einar CrowAnderson, Marshall Other Clinician: Referring Hale Chalfin: Einar CrowAnderson, Marshall Treating Letrice Pollok/Extender: Linwood DibblesSTONE III, HOYT Weeks in Treatment: 0 Edema Assessment Assessed: [Left: No] [Right: No] Edema: [Left: Yes] [Right: Yes] Calf Left: Right: Point of Measurement: 32 cm From Medial Instep 62 cm 61 cm Ankle Left: Right: Point of Measurement: 9 cm From Medial Instep 40.5 cm 37 cm Vascular Assessment Pulses: Dorsalis Pedis Palpable: [Left:Yes] [Right:Yes] Electronic Signature(Dickson) Signed: 05/31/2019 5:10:54 PM By: Curtis Sitesorthy, Joanna Entered By: Curtis Sitesorthy, Joanna on 05/31/2019 10:30:37 Laurie MuttonBALDWIN, Laurie Dickson. ([id number]) -------------------------------------------------------------------------------- Multi Wound Chart Details Patient Name: Laurie MuttonBALDWIN, Laurie Dickson. Date of Service: 05/31/2019 10:15 AM Medical Record Number: [id number] Patient Account Number: [id number] Date of Birth/Sex: 05/04/1951 (68 y.o. F) Treating RN: Rodell PernaScott, Dajea Primary Care Laban Orourke: Einar CrowAnderson, Marshall Other Clinician: Referring Stori Royse: Einar CrowAnderson, Marshall Treating Sumedha Munnerlyn/Extender: Linwood DibblesSTONE III,  HOYT Weeks in Treatment: 0 Vital Signs Height(in): 64 Pulse(bpm): 66 Weight(lbs): 280 Blood Pressure(mmHg): 134/59 Body Mass Index(BMI): 48 Temperature(F): 98.7 Respiratory Rate 16 (breaths/min): Photos: [N/A:N/A] Wound Location: Left Lower Leg - Anterior Right Lower Leg - Anterior N/A Wounding Event: Gradually Appeared Gradually Appeared N/A Primary Etiology: Lymphedema Lymphedema N/A Comorbid History: Hypertension, Type II Diabetes Hypertension, Type II Diabetes N/A Date Acquired: 05/04/2019 05/04/2019 N/A Weeks of Treatment: 0 0 N/A Wound Status: Open Open N/A Measurements L x W x D  0.1x0.1x0.1 0.1x0.1x0.1 N/A (cm) Area (cm) : 0.008 0.008 N/A Volume (cm) : 0.001 0.001 N/A % Reduction in Area: 99.90% 100.00% N/A % Reduction in Volume: 99.90% 100.00% N/A Classification: Partial Thickness Partial Thickness N/A Exudate Amount: None Present None Present N/A Wound Margin: Flat and Intact Flat and Intact N/A Granulation Amount: None Present (0%) None Present (0%) N/A Necrotic Amount: None Present (0%) None Present (0%) N/A Exposed Structures: Fascia: No Fascia: No N/A Fat Layer (Subcutaneous Fat Layer (Subcutaneous Tissue) Exposed: No Tissue) Exposed: No Tendon: No Tendon: No Muscle: No Muscle: No Joint: No Joint: No Bone: No Bone: No Limited to Skin Breakdown Limited to Skin Breakdown Epithelialization: Large (67-100%) None N/A Treatment Notes Laurie Dickson, Laurie Dickson ([id number]) Electronic Signature(Dickson) Signed: 05/31/2019 5:01:07 PM By: Rodell Perna Entered By: Rodell Perna on 05/31/2019 10:42:49 Laurie Dickson ([id number]) -------------------------------------------------------------------------------- Multi-Disciplinary Care Plan Details Patient Name: Laurie Dickson Date of Service: 05/31/2019 10:15 AM Medical Record Number: [id number] Patient Account Number: [id number] Date of Birth/Sex: [birth year] (68 y.o. F) Treating RN: Rodell Perna Primary Care Jabre Heo:  Einar Crow Other Clinician: Referring Brecklynn Jian: Einar Crow Treating Elvin Mccartin/Extender: Linwood Dibbles, HOYT Weeks in Treatment: 0 Active Inactive Abuse / Safety / Falls / Self Care Management Nursing Diagnoses: Potential for falls Goals: Patient will remain injury free related to falls Date Initiated: 05/25/2019 Target Resolution Date: 08/04/2019 Goal Status: Active Interventions: Assess fall risk on admission and as needed Notes: Orientation to the Wound Care Program Nursing Diagnoses: Knowledge deficit related to the wound healing center program Goals: Patient/caregiver will verbalize understanding of the Wound Healing Center Program Date Initiated: 05/25/2019 Target Resolution Date: 08/04/2019 Goal Status: Active Interventions: Provide education on orientation to the wound center Notes: Venous Leg Ulcer Nursing Diagnoses: Potential for venous Insuffiency (use before diagnosis confirmed) Goals: Patient will maintain optimal edema control Date Initiated: 05/25/2019 Target Resolution Date: 08/04/2019 Goal Status: Active Interventions: Compression as ordered Laurie Dickson, Laurie Dickson ([id number]) Notes: Wound/Skin Impairment Nursing Diagnoses: Impaired tissue integrity Goals: Ulcer/skin breakdown will heal within 14 weeks Date Initiated: 05/25/2019 Target Resolution Date: 08/04/2019 Goal Status: Active Interventions: Assess patient/caregiver ability to obtain necessary supplies Assess patient/caregiver ability to perform ulcer/skin care regimen upon admission and as needed Assess ulceration(Dickson) every visit Notes: Electronic Signature(Dickson) Signed: 05/31/2019 5:01:07 PM By: Rodell Perna Entered By: Rodell Perna on 05/31/2019 10:42:40 Laurie Dickson ([id number]) -------------------------------------------------------------------------------- Pain Assessment Details Patient Name: Laurie Dickson Date of Service: 05/31/2019 10:15 AM Medical Record Number:  [id number] Patient Account Number: [id number] Date of Birth/Sex: 10/24/1950 (68 y.o. F) Treating RN: Curtis Sites Primary Care Kahliya Fraleigh: Einar Crow Other Clinician: Referring Ryver Poblete: Einar Crow Treating Lazara Grieser/Extender: Linwood Dibbles, HOYT Weeks in Treatment: 0 Active Problems Location of Pain Severity and Description of Pain Patient Has Paino No Site Locations Pain Management and Medication Current Pain Management: Electronic Signature(Dickson) Signed: 05/31/2019 5:10:54 PM By: Curtis Sites Entered By: Curtis Sites on 05/31/2019 10:21:01 Laurie Dickson ([id number]) -------------------------------------------------------------------------------- Patient/Caregiver Education Details Patient Name: Laurie Dickson Date of Service: 05/31/2019 10:15 AM Medical Record Number: [id number] Patient Account Number: [id number] Date of Birth/Gender: Nov 09, 1950 (68 y.o. F) Treating RN: Rodell Perna Primary Care Physician: Einar Crow Other Clinician: Referring Physician: Einar Crow Treating Physician/Extender: Linwood Dibbles, HOYT Weeks in Treatment: 0 Education Assessment Education Provided To: Patient Education Topics Provided Wound/Skin Impairment: Handouts: Caring for Your Ulcer Methods: Demonstration, Explain/Verbal Responses: State content correctly Electronic Signature(Dickson) Signed: 05/31/2019 5:01:07 PM By: Rodell Perna Entered By: Rodell Perna on 05/31/2019 10:46:56 Laurie Dickson, Laurie Dickson ([id number]) -------------------------------------------------------------------------------- Wound  Assessment Details Patient Name: Laurie Dickson, Laurie Dickson Date of Service: 05/31/2019 10:15 AM Medical Record Number: [id number] Patient Account Number: [id number] Date of Birth/Sex: 04-22-1951 (68 y.o. F) Treating RN: Curtis Sites Primary Care Aaran Enberg: Einar Crow Other Clinician: Referring Omeed Osuna: Einar Crow Treating Jakhi Dishman/Extender: Linwood Dibbles, HOYT Weeks in  Treatment: 0 Wound Status Wound Number: 1 Primary Etiology: Lymphedema Wound Location: Left Lower Leg - Anterior Wound Status: Open Wounding Event: Gradually Appeared Comorbid History: Hypertension, Type II Diabetes Date Acquired: 05/04/2019 Weeks Of Treatment: 0 Clustered Wound: No Photos Wound Measurements Length: (cm) 0.1 Width: (cm) 0.1 Depth: (cm) 0.1 Area: (cm) 0.008 Volume: (cm) 0.001 % Reduction in Area: 99.9% % Reduction in Volume: 99.9% Epithelialization: Large (67-100%) Tunneling: No Undermining: No Wound Description Classification: Partial Thickness Foul Odor Af Wound Margin: Flat and Intact Slough/Fibri Exudate Amount: None Present ter Cleansing: No no No Wound Bed Granulation Amount: None Present (0%) Exposed Structure Necrotic Amount: None Present (0%) Fascia Exposed: No Fat Layer (Subcutaneous Tissue) Exposed: No Tendon Exposed: No Muscle Exposed: No Joint Exposed: No Bone Exposed: No Limited to Skin Breakdown Treatment Notes Wound #1 (Left, Anterior Lower Leg) Laurie Dickson, Laurie Dickson. ([id number]) Notes silver cell, ABD, Tubi grip G Electronic Signature(Dickson) Signed: 05/31/2019 5:10:54 PM By: Curtis Sites Entered By: Curtis Sites on 05/31/2019 10:35:30 Laurie Dickson ([id number]) -------------------------------------------------------------------------------- Wound Assessment Details Patient Name: Laurie Dickson Date of Service: 05/31/2019 10:15 AM Medical Record Number: [id number] Patient Account Number: [id number] Date of Birth/Sex: June 29, 1951 (68 y.o. F) Treating RN: Curtis Sites Primary Care Ebert Forrester: Einar Crow Other Clinician: Referring Leroi Haque: Einar Crow Treating Pyper Olexa/Extender: Linwood Dibbles, HOYT Weeks in Treatment: 0 Wound Status Wound Number: 2 Primary Etiology: Lymphedema Wound Location: Right Lower Leg - Anterior Wound Status: Open Wounding Event: Gradually Appeared Comorbid History: Hypertension, Type II  Diabetes Date Acquired: 05/04/2019 Weeks Of Treatment: 0 Clustered Wound: No Photos Wound Measurements Length: (cm) 0.1 % Reduction i Width: (cm) 0.1 % Reduction i Depth: (cm) 0.1 Epithelializa Area: (cm) 0.008 Tunneling: Volume: (cm) 0.001 Undermining: n Area: 100% n Volume: 100% tion: None No No Wound Description Classification: Partial Thickness Foul Odor Aft Wound Margin: Flat and Intact Slough/Fibrin Exudate Amount: None Present er Cleansing: No o No Wound Bed Granulation Amount: None Present (0%) Exposed Structure Necrotic Amount: None Present (0%) Fascia Exposed: No Fat Layer (Subcutaneous Tissue) Exposed: No Tendon Exposed: No Muscle Exposed: No Joint Exposed: No Bone Exposed: No Limited to Skin Breakdown Treatment Notes Wound #2 (Right, Anterior Lower Leg) Laurie Dickson, Laurie Dickson. ([id number]) Notes silver cell, ABD, Tubi grip G Electronic Signature(Dickson) Signed: 05/31/2019 5:10:54 PM By: Curtis Sites Entered By: Curtis Sites on 05/31/2019 10:35:58 Laurie Dickson ([id number]) -------------------------------------------------------------------------------- Vitals Details Patient Name: Laurie Dickson Date of Service: 05/31/2019 10:15 AM Medical Record Number: [id number] Patient Account Number: [id number] Date of Birth/Sex: Sep 06, 1950 (68 y.o. F) Treating RN: Curtis Sites Primary Care Haji Delaine: Einar Crow Other Clinician: Referring Gizell Danser: Einar Crow Treating Maridee Slape/Extender: Linwood Dibbles, HOYT Weeks in Treatment: 0 Vital Signs Time Taken: 10:21 Temperature (F): 98.7 Height (in): 64 Pulse (bpm): 66 Weight (lbs): 280 Respiratory Rate (breaths/min): 16 Body Mass Index (BMI): 48.1 Blood Pressure (mmHg): 134/59 Reference Range: 80 - 120 mg / dl Electronic Signature(Dickson) Signed: 05/31/2019 5:10:54 PM By: Curtis Sites Entered By: Curtis Sites on 05/31/2019 10:26:09

## 2019-05-31 NOTE — Progress Notes (Addendum)
Laurie Dickson, Sanjana S. ([id number]) Visit Report for 05/31/2019 Chief Complaint Document Details Patient Name: Laurie Dickson, Laurie S. Date of Service: 05/31/2019 10:15 AM Medical Record Number: [id number] Patient Account Number: [id number] Date of Birth/Sex: 03/13/1951 (68 y.o. F) Treating RN: Rodell PernaScott, Dajea Primary Care Provider: Einar CrowAnderson, Marshall Other Clinician: Referring Provider: Einar CrowAnderson, Marshall Treating Provider/Extender: Linwood DibblesSTONE III, Raymir Frommelt Weeks in Treatment: 0 Information Obtained from: Patient Chief Complaint Bilateral LE Ulcers and lymphedema Electronic Signature(s) Signed: 05/31/2019 10:41:07 AM By: Lenda KelpStone III, Amyria Komar PA-C Entered By: Lenda KelpStone III, Bayleigh Loflin on 05/31/2019 10:41:06 Laurie Dickson, [name] ([id number]) -------------------------------------------------------------------------------- HPI Details Patient Name: Laurie Dickson, Laurie S. Date of Service: 05/31/2019 10:15 AM Medical Record Number: [id number] Patient Account Number: [id number] Date of Birth/Sex: 10/11/1950 (68 y.o. F) Treating RN: Rodell PernaScott, Dajea Primary Care Provider: Einar CrowAnderson, Marshall Other Clinician: Referring Provider: Einar CrowAnderson, Marshall Treating Provider/Extender: Linwood DibblesSTONE III, Kalyiah Saintil Weeks in Treatment: 0 History of Present Illness HPI Description: 05/25/2019 on evaluation today patient presents for evaluation here in the clinic concerning issues that she has been having for the past 3 weeks with her bilateral lower extremities. She tells me that she has been tolerating utilizing the lymphedema pumps for about 2 hours in the middle the night around 1 AM when she wakes up she typically goes to bed early around 6 wakes up in the middle the night uses her pumps that is only been more recent. Though she feels like it is aggravating her legs to some degree. She is not been using any dressings on her legs at this point. Fortunately there is no signs of active infection either at this time though she has been placed on Keflex I do not see any evidence  of cellulitis I will have her complete that nonetheless. With regard to her treatment she has been prescribed torsemide along with potassium just by her primary care provider recently. She tells me that she has been taking those but was just given them again earlier this week. Obviously that can help some with her edema control. She has not been wearing any compression stockings he tells me that she used to have some but they were too small and she can never get those on. Fortunately there is no signs of active infection at this time locally or systemically. No fever chills noted. She is a former smoker but quit 10 years ago. I do believe that the patient is in require some compression to try to get her edema under control. She tells me that she does sleep in the bed and when she is sitting on the couch or so long she is elevating her legs according to what she tells me. 05/31/2019 on evaluation today patient actually is here today for an early visit by 1 day compared to when she was supposed to be coming in tomorrow. Subsequently she actually has her arterial study with vascular tomorrow at 3 PM. For that reason we probably will not rewrap her today but will rather use Tubigrip so that she will be able to take this off for her test. Fortunately her wounds actually seem to be doing much better in fact I think she is very close to complete resolution as it is. I discussed with her today that once everything is healed that we likely will make referral to lymphedema clinic for her to aid in managing her lymphedema in regard to wrapping. Electronic Signature(s) Signed: 05/31/2019 10:59:15 AM By: Lenda KelpStone III, Jamerion Cabello PA-C Entered By: Lenda KelpStone III, Heiress Williamson on 05/31/2019 10:59:15 Laurie Dickson, Reaghan S. ([id number]) -------------------------------------------------------------------------------- Physical Exam  Details Patient Name: Dickson, Laurie Date of Service: 05/31/2019 10:15 AM Medical Record Number:  [id number] Patient Account Number: [id number] Date of Birth/Sex: Mar 06, 1951 (68 y.o. F) Treating RN: Rodell Perna Primary Care Provider: Einar Crow Other Clinician: Referring Provider: Einar Crow Treating Provider/Extender: STONE III, Rhiana Morash Weeks in Treatment: 0 Constitutional Well-nourished and well-hydrated in no acute distress. Respiratory normal breathing without difficulty. clear to auscultation bilaterally. Cardiovascular regular rate and rhythm with normal S1, S2. Psychiatric this patient is able to make decisions and demonstrates good insight into disease process. Alert and Oriented x 3. pleasant and cooperative. Notes Patient's wound bed currently showed signs of good granulation and epithelialization at this time there are only very small areas even remaining open at this point overall she is doing quite well based on what I am seeing. With that being said she does have significant lymphedema she does have lymphedema pumps. Her lymphedema I would classify as stage III. Electronic Signature(s) Signed: 05/31/2019 10:59:44 AM By: Lenda Kelp PA-C Entered By: Lenda Kelp on 05/31/2019 10:59:44 Laurie Dickson ([id number]) -------------------------------------------------------------------------------- Physician Orders Details Patient Name: Laurie Dickson Date of Service: 05/31/2019 10:15 AM Medical Record Number: [id number] Patient Account Number: [id number] Date of Birth/Sex: [birth year] (68 y.o. F) Treating RN: Rodell Perna Primary Care Provider: Einar Crow Other Clinician: Referring Provider: Einar Crow Treating Provider/Extender: Linwood Dibbles, Bethenny Losee Weeks in Treatment: 0 Verbal / Phone Orders: No Diagnosis Coding ICD-10 Coding Code Description I89.0 Lymphedema, not elsewhere classified L97.822 Non-pressure chronic ulcer of other part of left lower leg with fat layer exposed L97.812 Non-pressure chronic ulcer of other part of right lower  leg with fat layer exposed E11.622 Type 2 diabetes mellitus with other skin ulcer I10 Essential (primary) hypertension Wound Cleansing Wound #1 Left,Anterior Lower Leg o May shower with protection. - Please do not get your wraps wet. You can get a cast protector from most pharmacies for showers Wound #2 Right,Anterior Lower Leg o May shower with protection. - Please do not get your wraps wet. You can get a cast protector from most pharmacies for showers Skin Barriers/Peri-Wound Care Wound #1 Left,Anterior Lower Leg o Moisturizing lotion Wound #2 Right,Anterior Lower Leg o Moisturizing lotion Primary Wound Dressing Wound #1 Left,Anterior Lower Leg o Silver Alginate Wound #2 Right,Anterior Lower Leg o Silver Alginate Secondary Dressing Wound #1 Left,Anterior Lower Leg o ABD pad Wound #2 Right,Anterior Lower Leg o ABD pad Dressing Change Frequency Wound #1 Left,Anterior Lower Leg o Change dressing every week o Other: - and as needed MARIAGUADALUPE, FIALKOWSKI. ([id number]) Wound #2 Right,Anterior Lower Leg o Change dressing every week o Other: - and as needed Follow-up Appointments Wound #1 Left,Anterior Lower Leg o Return Appointment in 1 week. o Nurse Visit as needed - Monday or Tuesday Wound #2 Right,Anterior Lower Leg o Return Appointment in 1 week. o Nurse Visit as needed - Monday or Tuesday Edema Control Wound #1 Left,Anterior Lower Leg o Compression Pump: Use compression pump on left lower extremity for 60 minutes, twice daily. o Compression Pump: Use compression pump on right lower extremity for 60 minutes, twice daily. o Other: - tubi grip bilateral Electronic Signature(s) Signed: 05/31/2019 5:01:07 PM By: Rodell Perna Signed: 05/31/2019 5:50:00 PM By: Lenda Kelp PA-C Entered By: Rodell Perna on 05/31/2019 10:45:55 CAROLEANN, CASLER  ([id number]) -------------------------------------------------------------------------------- Problem List Details Patient Name: Laurie Dickson Date of Service: 05/31/2019 10:15 AM Medical Record Number: [id number] Patient Account Number: [id number] Date of Birth/Sex: April 19, 1951 (68 y.o. F) Treating  RN: Rodell Perna Primary Care Provider: Einar Crow Other Clinician: Referring Provider: Einar Crow Treating Provider/Extender: Linwood Dibbles, Kabrina Christiano Weeks in Treatment: 0 Active Problems ICD-10 Evaluated Encounter Code Description Active Date Today Diagnosis I89.0 Lymphedema, not elsewhere classified 05/25/2019 No Yes L97.822 Non-pressure chronic ulcer of other part of left lower leg with 05/25/2019 No Yes fat layer exposed L97.812 Non-pressure chronic ulcer of other part of right lower leg 05/25/2019 No Yes with fat layer exposed E11.622 Type 2 diabetes mellitus with other skin ulcer 05/25/2019 No Yes I10 Essential (primary) hypertension 05/25/2019 No Yes Inactive Problems Resolved Problems Electronic Signature(s) Signed: 05/31/2019 10:40:57 AM By: Lenda Kelp PA-C Entered By: Lenda Kelp on 05/31/2019 10:40:57 Laurie Dickson ([id number]) -------------------------------------------------------------------------------- Progress Note Details Patient Name: Laurie Dickson Date of Service: 05/31/2019 10:15 AM Medical Record Number: [id number] Patient Account Number: [id number] Date of Birth/Sex: [birth year] (68 y.o. F) Treating RN: Rodell Perna Primary Care Provider: Einar Crow Other Clinician: Referring Provider: Einar Crow Treating Provider/Extender: Linwood Dibbles, Sosie Gato Weeks in Treatment: 0 Subjective Chief Complaint Information obtained from Patient Bilateral LE Ulcers and lymphedema History of Present Illness (HPI) 05/25/2019 on evaluation today patient presents for evaluation here in the clinic concerning issues that she has been having for the past 3  weeks with her bilateral lower extremities. She tells me that she has been tolerating utilizing the lymphedema pumps for about 2 hours in the middle the night around 1 AM when she wakes up she typically goes to bed early around 6 wakes up in the middle the night uses her pumps that is only been more recent. Though she feels like it is aggravating her legs to some degree. She is not been using any dressings on her legs at this point. Fortunately there is no signs of active infection either at this time though she has been placed on Keflex I do not see any evidence of cellulitis I will have her complete that nonetheless. With regard to her treatment she has been prescribed torsemide along with potassium just by her primary care provider recently. She tells me that she has been taking those but was just given them again earlier this week. Obviously that can help some with her edema control. She has not been wearing any compression stockings he tells me that she used to have some but they were too small and she can never get those on. Fortunately there is no signs of active infection at this time locally or systemically. No fever chills noted. She is a former smoker but quit 10 years ago. I do believe that the patient is in require some compression to try to get her edema under control. She tells me that she does sleep in the bed and when she is sitting on the couch or so long she is elevating her legs according to what she tells me. 05/31/2019 on evaluation today patient actually is here today for an early visit by 1 day compared to when she was supposed to be coming in tomorrow. Subsequently she actually has her arterial study with vascular tomorrow at 3 PM. For that reason we probably will not rewrap her today but will rather use Tubigrip so that she will be able to take this off for her test. Fortunately her wounds actually seem to be doing much better in fact I think she is very close to complete  resolution as it is. I discussed with her today that once everything is healed that we likely will make referral  to lymphedema clinic for her to aid in managing her lymphedema in regard to wrapping. Patient History Information obtained from Patient. Family History Heart Disease - Father, Kidney Disease - Father, No family history of Cancer, Diabetes, Hereditary Spherocytosis, Hypertension, Lung Disease, Seizures, Stroke, Thyroid Problems, Tuberculosis. Social History Former smoker - 20 years - ended on 08/30/2008, Marital Status - Married, Alcohol Use - Never, Drug Use - No History, Caffeine Use - Daily - tea. Medical History Eyes Denies history of Cataracts, Glaucoma, Optic Neuritis Ear/Nose/Mouth/Throat Denies history of Chronic sinus problems/congestion, Middle ear problems Hematologic/Lymphatic Denies history of Anemia, Hemophilia, Human Immunodeficiency Virus, Lymphedema, Sickle Cell Disease Hupfer, Nastassia S. ([id number]) Respiratory Denies history of Aspiration, Asthma, Chronic Obstructive Pulmonary Disease (COPD), Pneumothorax, Sleep Apnea, Tuberculosis Cardiovascular Patient has history of Hypertension Denies history of Angina, Arrhythmia, Congestive Heart Failure, Coronary Artery Disease, Deep Vein Thrombosis, Hypotension, Myocardial Infarction, Peripheral Arterial Disease, Peripheral Venous Disease, Phlebitis, Vasculitis Gastrointestinal Denies history of Cirrhosis , Colitis, Crohn s, Hepatitis A, Hepatitis B, Hepatitis C Endocrine Patient has history of Type II Diabetes - past, not anymore Genitourinary Denies history of End Stage Renal Disease Immunological Denies history of Lupus Erythematosus, Raynaud s, Scleroderma Integumentary (Skin) Denies history of History of Burn, History of pressure wounds Musculoskeletal Denies history of Gout, Rheumatoid Arthritis, Osteoarthritis, Osteomyelitis Neurologic Denies history of Dementia, Neuropathy, Quadriplegia, Paraplegia,  Seizure Disorder Oncologic Denies history of Received Chemotherapy, Received Radiation Psychiatric Denies history of Anorexia/bulimia, Confinement Anxiety Review of Systems (ROS) Constitutional Symptoms (General Health) Denies complaints or symptoms of Fatigue, Fever, Chills, Marked Weight Change. Respiratory Denies complaints or symptoms of Chronic or frequent coughs, Shortness of Breath. Cardiovascular Complains or has symptoms of LE edema. Denies complaints or symptoms of Chest pain. Psychiatric Denies complaints or symptoms of Anxiety, Claustrophobia. Objective Constitutional Well-nourished and well-hydrated in no acute distress. Vitals Time Taken: 10:21 AM, Height: 64 in, Weight: 280 lbs, BMI: 48.1, Temperature: 98.7 F, Pulse: 66 bpm, Respiratory Rate: 16 breaths/min, Blood Pressure: 134/59 mmHg. Respiratory normal breathing without difficulty. clear to auscultation bilaterally. Cardiovascular regular rate and rhythm with normal S1, S2. Frappier, Atira S. ([id number]) Psychiatric this patient is able to make decisions and demonstrates good insight into disease process. Alert and Oriented x 3. pleasant and cooperative. General Notes: Patient's wound bed currently showed signs of good granulation and epithelialization at this time there are only very small areas even remaining open at this point overall she is doing quite well based on what I am seeing. With that being said she does have significant lymphedema she does have lymphedema pumps. Her lymphedema I would classify as stage III. Integumentary (Hair, Skin) Wound #1 status is Open. Original cause of wound was Gradually Appeared. The wound is located on the Left,Anterior Lower Leg. The wound measures 0.1cm length x 0.1cm width x 0.1cm depth; 0.008cm^2 area and 0.001cm^3 volume. The wound is limited to skin breakdown. There is no tunneling or undermining noted. There is a none present amount of drainage noted. The wound  margin is flat and intact. There is no granulation within the wound bed. There is no necrotic tissue within the wound bed. Wound #2 status is Open. Original cause of wound was Gradually Appeared. The wound is located on the Right,Anterior Lower Leg. The wound measures 0.1cm length x 0.1cm width x 0.1cm depth; 0.008cm^2 area and 0.001cm^3 volume. The wound is limited to skin breakdown. There is no tunneling or undermining noted. There is a none present amount of drainage noted. The wound margin  is flat and intact. There is no granulation within the wound bed. There is no necrotic tissue within the wound bed. 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 - Please do not get your wraps wet. You can get a cast protector from most pharmacies for showers Wound #2 Right,Anterior Lower Leg: May shower with protection. - Please do not get your wraps wet. You can get a cast protector from most pharmacies for showers Skin Barriers/Peri-Wound Care: Wound #1 Left,Anterior Lower Leg: Moisturizing lotion Wound #2 Right,Anterior Lower Leg: Moisturizing lotion Primary Wound Dressing: JANNIE, DOYLE ([id number]) Wound #1 Left,Anterior Lower Leg: Silver Alginate Wound #2 Right,Anterior Lower Leg: Silver Alginate Secondary Dressing: Wound #1 Left,Anterior Lower Leg: ABD pad Wound #2 Right,Anterior Lower Leg: ABD pad Dressing Change Frequency: Wound #1 Left,Anterior Lower Leg: Change dressing every week Other: - and as needed Wound #2 Right,Anterior Lower Leg: Change dressing every week Other: - and as needed Follow-up Appointments: Wound #1  Left,Anterior Lower Leg: Return Appointment in 1 week. Nurse Visit as needed - Monday or Tuesday Wound #2 Right,Anterior Lower Leg: Return Appointment in 1 week. Nurse Visit as needed - Monday or Tuesday Edema Control: Wound #1 Left,Anterior Lower Leg: Compression Pump: Use compression pump on left lower extremity for 60 minutes, twice daily. Compression Pump: Use compression pump on right lower extremity for 60 minutes, twice daily. Other: - tubi grip bilateral 1. My suggestion currently is good to be that we hold off on wrapping her since she is going for an arterial study with vascular there is really no need for [place] to put a wrap on today for them to take off tomorrow. We will therefore use Tubigrip for the time being today. 2. We will actually go ahead and plan to see the patient back for reevaluation either Monday or Tuesday in order to go ahead and see about getting the wrap back on for her at that time. We will be able to discuss at that point whether or not we go to just an Unna boot wrap or potentially up this to a 3 layer compression wrap. Honestly I am pretty pleased with how the Unna boot did for her we will likely just stick with this until we can get her into the lymphedema clinic. 3. I am going to fill out the FMLA paperwork for her visits to help in regard to her employer with the fact that she is having to come for these visits for the time being. We will see patient back for reevaluation in 1 week here in the clinic. If anything worsens or changes patient will contact our office for additional recommendations. Electronic Signature(s) Signed: 05/31/2019 11:01:14 AM By: Lenda Kelp PA-C Entered By: Lenda Kelp on 05/31/2019 11:01:13 LETASHA, KERSHAW ([id number]) -------------------------------------------------------------------------------- ROS/PFSH Details Patient Name: Laurie Dickson Date of Service: 05/31/2019 10:15 AM Medical Record Number:  [id number] Patient Account Number: [id number] Date of Birth/Sex: 16-Jul-1951 (68 y.o. F) Treating RN: Rodell Perna Primary Care Provider: Einar Crow Other Clinician: Referring Provider: Einar Crow Treating Provider/Extender: Linwood Dibbles, Denzal Meir Weeks in Treatment: 0 Information Obtained From Patient Constitutional Symptoms (General Health) Complaints and Symptoms: Negative for: Fatigue; Fever; Chills; Marked Weight Change  Respiratory Complaints and Symptoms: Negative for: Chronic or frequent coughs; Shortness of Breath Medical History: Negative for: Aspiration; Asthma; Chronic Obstructive Pulmonary Disease (COPD); Pneumothorax; Sleep Apnea; Tuberculosis Cardiovascular Complaints and Symptoms: Positive for: LE edema Negative for: Chest pain Medical History: Positive for: Hypertension Negative for: Angina; Arrhythmia; Congestive Heart Failure; Coronary Artery Disease; Deep Vein Thrombosis; Hypotension; Myocardial Infarction; Peripheral Arterial Disease; Peripheral Venous Disease; Phlebitis; Vasculitis Psychiatric Complaints and Symptoms: Negative for: Anxiety; Claustrophobia Medical History: Negative for: Anorexia/bulimia; Confinement Anxiety Eyes Medical History: Negative for: Cataracts; Glaucoma; Optic Neuritis Ear/Nose/Mouth/Throat Medical History: Negative for: Chronic sinus problems/congestion; Middle ear problems Hematologic/Lymphatic Medical History: Negative for: Anemia; Hemophilia; Human Immunodeficiency Virus; Lymphedema; Sickle Cell Disease ANETA, HENDERSHOTT ([id number]) Gastrointestinal Medical History: Negative for: Cirrhosis ; Colitis; Crohnos; Hepatitis A; Hepatitis B; Hepatitis C Endocrine Medical History: Positive for: Type II Diabetes - past, not anymore Genitourinary Medical History: Negative for: End Stage Renal Disease Immunological Medical History: Negative for: Lupus Erythematosus; Raynaudos; Scleroderma Integumentary (Skin) Medical  History: Negative for: History of Burn; History of pressure wounds Musculoskeletal Medical History: Negative for: Gout; Rheumatoid Arthritis; Osteoarthritis; Osteomyelitis Neurologic Medical History: Negative for: Dementia; Neuropathy; Quadriplegia; Paraplegia; Seizure Disorder Oncologic Medical History: Negative for: Received Chemotherapy; Received Radiation Immunizations Pneumococcal Vaccine: Received Pneumococcal Vaccination: No Implantable Devices None Family and Social History Cancer: No; Diabetes: No; Heart Disease: Yes - Father; Hereditary Spherocytosis: No; Hypertension: No; Kidney Disease: Yes - Father; Lung Disease: No; Seizures: No; Stroke: No; Thyroid Problems: No; Tuberculosis: No; Former smoker - 20 years - ended on 08/30/2008; Marital Status - Married; Alcohol Use: Never; Drug Use: No History; Caffeine Use: Daily - tea; Financial Concerns: No; Food, Clothing or Shelter Needs: No; Support System Lacking: No; Transportation Concerns: No Physician Affirmation I have reviewed and agree with the above information. Electronic Signature(s) BINNIE, DROESSLER ([id number]) Signed: 05/31/2019 5:01:07 PM By: Army Melia Signed: 05/31/2019 5:50:00 PM By: Worthy Keeler PA-C Entered By: Worthy Keeler on 05/31/2019 10:59:30 MAEBEL, MARASCO ([id number]) -------------------------------------------------------------------------------- SuperBill Details Patient Name: Simonne Martinet Date of Service: 05/31/2019 Medical Record Number: [id number] Patient Account Number: [id number] Date of Birth/Sex: October 06, 1950 (68 y.o. F) Treating RN: Army Melia Primary Care Provider: Frazier Richards Other Clinician: Referring Provider: Frazier Richards Treating Provider/Extender: Melburn Hake, Atasha Colebank Weeks in Treatment: 0 Diagnosis Coding ICD-10 Codes Code Description I89.0 Lymphedema, not elsewhere classified L97.822 Non-pressure chronic ulcer of other part of left lower leg with fat layer  exposed L97.812 Non-pressure chronic ulcer of other part of right lower leg with fat layer exposed E11.622 Type 2 diabetes mellitus with other skin ulcer I10 Essential (primary) hypertension Facility Procedures CPT4 Code: [id number] Description: 99213 - WOUND CARE VISIT-LEV 3 EST PT Modifier: Quantity: 1 Physician Procedures CPT4 Code Description: [id number] 99833 - WC PHYS LEVEL 4 - EST PT ICD-10 Diagnosis Description I89.0 Lymphedema, not elsewhere classified L97.822 Non-pressure chronic ulcer of other part of left lower leg wit L97.812 Non-pressure chronic ulcer of other  part of right lower leg wi E11.622 Type 2 diabetes mellitus with other skin ulcer Modifier: h fat layer expos th fat layer expo Quantity: 1 ed sed Electronic Signature(s) Signed: 05/31/2019 11:01:33 AM By: Worthy Keeler PA-C Entered By: Worthy Keeler on 05/31/2019 11:01:33

## 2019-06-01 ENCOUNTER — Ambulatory Visit (INDEPENDENT_AMBULATORY_CARE_PROVIDER_SITE_OTHER): Payer: Managed Care, Other (non HMO)

## 2019-06-01 ENCOUNTER — Encounter: Payer: Managed Care, Other (non HMO) | Admitting: Physician Assistant

## 2019-06-01 DIAGNOSIS — L97812 Non-pressure chronic ulcer of other part of right lower leg with fat layer exposed: Secondary | ICD-10-CM

## 2019-06-01 DIAGNOSIS — I890 Lymphedema, not elsewhere classified: Secondary | ICD-10-CM | POA: Diagnosis not present

## 2019-06-01 DIAGNOSIS — L97822 Non-pressure chronic ulcer of other part of left lower leg with fat layer exposed: Secondary | ICD-10-CM | POA: Diagnosis not present

## 2019-06-04 ENCOUNTER — Other Ambulatory Visit: Payer: Self-pay

## 2019-06-04 DIAGNOSIS — I890 Lymphedema, not elsewhere classified: Secondary | ICD-10-CM | POA: Diagnosis not present

## 2019-06-04 NOTE — Progress Notes (Addendum)
KEALA, DRUM ([id number]) Visit Report for 06/04/2019 Arrival Information Details Patient Name: Laurie Dickson, Laurie Dickson Date of Service: 06/04/2019 8:45 AM Medical Record Number: [id number] Patient Account Number: [id number] Date of Birth/Sex: [birth year] (68 y.o. F) Treating RN: Curtis Sites Primary Care Kamia Insalaco: Einar Crow Other Clinician: Referring Bernardo Brayman: Einar Crow Treating Adajah Cocking/Extender: Linwood Dibbles, HOYT Weeks in Treatment: 1 Visit Information History Since Last Visit Added or deleted any medications: No Patient Arrived: Ambulatory Any new allergies or adverse reactions: No Arrival Time: 08:37 Had a fall or experienced change in No Accompanied By: self activities of daily living that may affect Transfer Assistance: None risk of falls: Patient Identification Verified: Yes Signs or symptoms of abuse/neglect since last visito No Secondary Verification Process Completed: Yes Hospitalized since last visit: No Implantable device outside of the clinic excluding No cellular tissue based products placed in the center since last visit: Has Dressing in Place as Prescribed: Yes Has Compression in Place as Prescribed: Yes Pain Present Now: No Electronic Signature(s) Signed: 06/04/2019 3:56:20 PM By: Curtis Sites Entered By: Curtis Sites on 06/04/2019 08:51:35 Laurie Dickson ([id number]) -------------------------------------------------------------------------------- Clinic Level of Care Assessment Details Patient Name: Laurie Dickson Date of Service: 06/04/2019 8:45 AM Medical Record Number: [id number] Patient Account Number: [id number] Date of Birth/Sex: 1950-12-12 (68 y.o. F) Treating RN: Curtis Sites Primary Care Asmar Brozek: Einar Crow Other Clinician: Referring Evelisse Szalkowski: Einar Crow Treating Kyser Wandel/Extender: Linwood Dibbles, HOYT Weeks in Treatment: 1 Clinic Level of Care Assessment Items TOOL 4 Quantity Score []  - Use when only an EandM is  performed on FOLLOW-UP visit 0 ASSESSMENTS - Nursing Assessment / Reassessment X - Reassessment of Co-morbidities (includes updates in patient status) 1 10 X- 1 5 Reassessment of Adherence to Treatment Plan ASSESSMENTS - Wound and Skin Assessment / Reassessment []  - Simple Wound Assessment / Reassessment - one wound 0 X- 2 5 Complex Wound Assessment / Reassessment - multiple wounds []  - 0 Dermatologic / Skin Assessment (not related to wound area) ASSESSMENTS - Focused Assessment X - Circumferential Edema Measurements - multi extremities 1 5 []  - 0 Nutritional Assessment / Counseling / Intervention X- 1 5 Lower Extremity Assessment (monofilament, tuning fork, pulses) []  - 0 Peripheral Arterial Disease Assessment (using hand held doppler) ASSESSMENTS - Ostomy and/or Continence Assessment and Care []  - Incontinence Assessment and Management 0 []  - 0 Ostomy Care Assessment and Management (repouching, etc.) PROCESS - Coordination of Care X - Simple Patient / Family Education for ongoing care 1 15 []  - 0 Complex (extensive) Patient / Family Education for ongoing care X- 1 10 Staff obtains , Records, Test Results / Process Orders []  - 0 Staff telephones HHA, Nursing Homes / Clarify orders / etc []  - 0 Routine Transfer to another Facility (non-emergent condition) []  - 0 Routine Hospital Admission (non-emergent condition) []  - 0 New Admissions / / Ordering NPWT, Apligraf, etc. []  - 0 Emergency Hospital Admission (emergent condition) X- 1 10 Simple Discharge Coordination SHAYLAN, TUTTON ( ) []  - 0 Complex (extensive) Discharge Coordination PROCESS - Special Needs []  - Pediatric / Minor Patient Management 0 []  - 0 Isolation Patient Management []  - 0 Hearing / Language / Visual special needs []  - 0 Assessment of Community assistance (transportation, D/C planning, etc.) []  - 0 Additional assistance / Altered mentation []  - 0 Support  Surface(s) Assessment (bed, cushion, seat, etc.) INTERVENTIONS - Wound Cleansing / Measurement []  - Simple Wound Cleansing - one wound 0 X- 2 5 Complex Wound Cleansing - multiple  wounds []  - 0 Wound Imaging (photographs - any number of wounds) []  - 0 Wound Tracing (instead of photographs) []  - 0 Simple Wound Measurement - one wound X- 2 5 Complex Wound Measurement - multiple wounds INTERVENTIONS - Wound Dressings X - Small Wound Dressing one or multiple wounds 2 10 []  - 0 Medium Wound Dressing one or multiple wounds []  - 0 Large Wound Dressing one or multiple wounds []  - 0 Application of Medications - topical []  - 0 Application of Medications - injection INTERVENTIONS - Miscellaneous []  - External ear exam 0 []  - 0 Specimen Collection (cultures, biopsies, blood, body fluids, etc.) []  - 0 Specimen(s) / Culture(s) sent or taken to Lab for analysis []  - 0 Patient Transfer (multiple staff / Nurse, adultHoyer Lift / Similar devices) []  - 0 Simple Staple / Suture removal (25 or less) []  - 0 Complex Staple / Suture removal (26 or more) []  - 0 Hypo / Hyperglycemic Management (close monitor of Blood Glucose) []  - 0 Ankle / Brachial Index (ABI) - do not check if billed separately []  - 0 Vital Signs Philbrook, Darely S. ([id number]) Has the patient been seen at the hospital within the last three years: Yes Total Score: 110 Level Of Care: New/Established - Level 3 Electronic Signature(s) Signed: 06/04/2019 3:56:20 PM By: Curtis Sitesorthy, Joanna Entered By: Curtis Sitesorthy, Joanna on 06/04/2019 08:55:25 Laurie MuttonBALDWIN, Laurie S. ([id number]) -------------------------------------------------------------------------------- Encounter Discharge Information Details Patient Name: Laurie MuttonBALDWIN, Laurie S. Date of Service: 06/04/2019 8:45 AM Medical Record Number: [id number] Patient Account Number: [id number] Date of Birth/Sex: 12/29/1950 (68 y.o. F) Treating RN: Huel CoventryWoody, Kim Primary Care Regginald Pask: Einar CrowAnderson, Marshall Other  Clinician: Referring Macalister Arnaud: Einar CrowAnderson, Marshall Treating Brittne Kawasaki/Extender: Linwood DibblesSTONE III, HOYT Weeks in Treatment: 1 Encounter Discharge Information Items Post Procedure Vitals Discharge Condition: Stable Unable to obtain vitals Reason: . Ambulatory Status: Ambulatory Discharge Destination: Home Transportation: Private Auto Accompanied By: self Schedule Follow-up Appointment: Yes Clinical Summary of Care: Electronic Signature(s) Signed: 06/04/2019 4:48:16 PM By: Elliot GurneyWoody, BSN, RN, CWS, Kim RN, BSN Entered By: Elliot GurneyWoody, BSN, RN, CWS, Kim on 06/04/2019 16:48:16 Laurie MuttonBALDWIN, Lailanie S. ([id number]) -------------------------------------------------------------------------------- Wound Assessment Details Patient Name: Laurie MuttonBALDWIN, Laurie S. Date of Service: 06/04/2019 8:45 AM Medical Record Number: [id number] Patient Account Number: [id number] Date of Birth/Sex: 12/02/1950 (68 y.o. F) Treating RN: Curtis Sitesorthy, Joanna Primary Care Olivine Hiers: Einar CrowAnderson, Marshall Other Clinician: Referring Mackenzi Krogh: Einar CrowAnderson, Marshall Treating Berenise Hunton/Extender: Linwood DibblesSTONE III, HOYT Weeks in Treatment: 1 Wound Status Wound Number: 1 Primary Etiology: Lymphedema Wound Location: Left Lower Leg - Anterior Wound Status: Open Wounding Event: Gradually Appeared Comorbid History: Hypertension, Type II Diabetes Date Acquired: 05/04/2019 Weeks Of Treatment: 1 Clustered Wound: No Wound Measurements Length: (cm) 0.1 Width: (cm) 0.1 Depth: (cm) 0.1 Area: (cm) 0.008 Volume: (cm) 0.001 % Reduction in Area: 99.9% % Reduction in Volume: 99.9% Epithelialization: Large (67-100%) Tunneling: No Undermining: No Wound Description Classification: Partial Thickness Wound Margin: Flat and Intact Exudate Amount: None Present Foul Odor After Cleansing: No Slough/Fibrino No Wound Bed Granulation Amount: None Present (0%) Exposed Structure Necrotic Amount: None Present (0%) Fascia Exposed: No Fat Layer (Subcutaneous Tissue) Exposed: No Tendon  Exposed: No Muscle Exposed: No Joint Exposed: No Bone Exposed: No Limited to Skin Breakdown Treatment Notes Wound #1 (Left, Anterior Lower Leg) Notes abd, conform and tubigrip g Electronic Signature(s) Signed: 06/04/2019 3:56:20 PM By: Curtis Sitesorthy, Joanna Entered By: Curtis Sitesorthy, Joanna on 06/04/2019 08:52:47 Laurie MuttonBALDWIN, Laurie S. ([id number]) -------------------------------------------------------------------------------- Wound Assessment Details Patient Name: Laurie MuttonBALDWIN, Laurie S. Date of Service: 06/04/2019 8:45 AM Medical Record Number: [id number] Patient Account Number:  [id number] Date of Birth/Sex: 07/29/1951 (68 y.o. F) Treating RN: Montey Hora Primary Care Krissa Utke: Frazier Richards Other Clinician: Referring Quince Santana: Frazier Richards Treating Kayah Hecker/Extender: Melburn Hake, HOYT Weeks in Treatment: 1 Wound Status Wound Number: 2 Primary Etiology: Lymphedema Wound Location: Right Lower Leg - Anterior Wound Status: Open Wounding Event: Gradually Appeared Comorbid History: Hypertension, Type II Diabetes Date Acquired: 05/04/2019 Weeks Of Treatment: 1 Clustered Wound: No Wound Measurements Length: (cm) 0.1 Width: (cm) 0.1 Depth: (cm) 0.1 Area: (cm) 0.008 Volume: (cm) 0.001 % Reduction in Area: 100% % Reduction in Volume: 100% Epithelialization: None Tunneling: No Undermining: No Wound Description Classification: Partial Thickness Foul O Wound Margin: Flat and Intact Slough Exudate Amount: None Present dor After Cleansing: No /Fibrino No Wound Bed Granulation Amount: None Present (0%) Exposed Structure Necrotic Amount: None Present (0%) Fascia Exposed: No Fat Layer (Subcutaneous Tissue) Exposed: No Tendon Exposed: No Muscle Exposed: No Joint Exposed: No Bone Exposed: No Limited to Skin Breakdown Treatment Notes Wound #2 (Right, Anterior Lower Leg) Notes abd, conform and tubigrip g Electronic Signature(s) Signed: 06/04/2019 3:56:20 PM By: Montey Hora Entered By:  Montey Hora on 06/04/2019 08:52:56

## 2019-06-04 NOTE — Progress Notes (Signed)
Laurie Dickson, Laurie Dickson ([id number]) Visit Report for 06/04/2019 Debridement Details Patient Name: Laurie Dickson, Laurie Dickson Date of Service: 06/04/2019 8:45 AM Medical Record Number: [id number] Patient Account Number: [id number] Date of Birth/Sex: 1951/03/03 (68 y.o. F) Treating RN: Montey Hora Primary Care Provider: Frazier Richards Other Clinician: Referring Provider: Frazier Richards Treating Provider/Extender: Melburn Hake, Tyson Masin Weeks in Treatment: 1 Debridement Performed for Wound #1 Left,Anterior Lower Leg Assessment: Performed By: Clinician Montey Hora, RN Debridement Type: Chemical/Enzymatic/Mechanical Agent Used: saline Level of Consciousness (Pre- Awake and Alert procedure): Pre-procedure Verification/Time Yes - 08:40 Out Taken: Start Time: 08:40 Pain Control: Lidocaine 4% Topical Solution Instrument: Other : gauze Bleeding: None End Time: 08:41 Procedural Pain: 0 Post Procedural Pain: 0 Response to Treatment: Procedure was tolerated well Level of Consciousness Awake and Alert (Post-procedure): Post Debridement Measurements of Total Wound Length: (cm) 0.1 Width: (cm) 0.1 Depth: (cm) 0.1 Volume: (cm) 0.001 Character of Wound/Ulcer Post Debridement: Improved Electronic Signature(s) Signed: 06/04/2019 3:56:20 PM By: Montey Hora Signed: 06/04/2019 4:22:49 PM By: Worthy Keeler PA-C Entered By: Montey Hora on 06/04/2019 08:53:57 Laurie Dickson ([id number]) -------------------------------------------------------------------------------- Debridement Details Patient Name: Laurie Dickson Date of Service: 06/04/2019 8:45 AM Medical Record Number: [id number] Patient Account Number: [id number] Date of Birth/Sex: 09-29-1950 (68 y.o. F) Treating RN: Montey Hora Primary Care Provider: Frazier Richards Other Clinician: Referring Provider: Frazier Richards Treating Provider/Extender: Melburn Hake, Soul Hackman Weeks in Treatment: 1 Debridement Performed for Wound #2  Right,Anterior Lower Leg Assessment: Performed By: Clinician Montey Hora, RN Debridement Type: Chemical/Enzymatic/Mechanical Agent Used: saline Level of Consciousness (Pre- Awake and Alert procedure): Pre-procedure Verification/Time Yes - 08:41 Out Taken: Start Time: 08:41 Pain Control: Lidocaine 4% Topical Solution Instrument: Other : gauze Bleeding: None End Time: 08:42 Procedural Pain: 0 Post Procedural Pain: 0 Response to Treatment: Procedure was tolerated well Level of Consciousness Awake and Alert (Post-procedure): Post Debridement Measurements of Total Wound Length: (cm) 0.1 Width: (cm) 0.1 Depth: (cm) 0.1 Volume: (cm) 0.001 Character of Wound/Ulcer Post Debridement: Improved Electronic Signature(s) Signed: 06/04/2019 3:56:20 PM By: Montey Hora Signed: 06/04/2019 4:22:49 PM By: Worthy Keeler PA-C Entered By: Montey Hora on 06/04/2019 08:54:19 Laurie Dickson ([id number]) -------------------------------------------------------------------------------- SuperBill Details Patient Name: Laurie Dickson Date of Service: 06/04/2019 Medical Record Number: [id number] Patient Account Number: [id number] Date of Birth/Sex: Jun 10, 1951 (68 y.o. F) Treating RN: Montey Hora Primary Care Provider: Frazier Richards Other Clinician: Referring Provider: Frazier Richards Treating Provider/Extender: Melburn Hake, Samaad Hashem Weeks in Treatment: 1 Diagnosis Coding ICD-10 Codes Code Description I89.0 Lymphedema, not elsewhere classified L97.822 Non-pressure chronic ulcer of other part of left lower leg with fat layer exposed L97.812 Non-pressure chronic ulcer of other part of right lower leg with fat layer exposed E11.622 Type 2 diabetes mellitus with other skin ulcer I10 Essential (primary) hypertension Facility Procedures CPT4 Code: [id number] Description: 24235 - WOUND CARE VISIT-LEV 3 EST PT Modifier: Quantity: 1 Electronic Signature(s) Signed: 06/04/2019 3:56:20 PM  By: Montey Hora Signed: 06/04/2019 4:22:49 PM By: Worthy Keeler PA-C Entered By: Montey Hora on 06/04/2019 08:55:37

## 2019-06-05 ENCOUNTER — Ambulatory Visit: Payer: Managed Care, Other (non HMO)

## 2019-06-08 ENCOUNTER — Encounter: Payer: Managed Care, Other (non HMO) | Admitting: Physician Assistant

## 2019-06-08 ENCOUNTER — Other Ambulatory Visit: Payer: Self-pay

## 2019-06-08 DIAGNOSIS — I890 Lymphedema, not elsewhere classified: Secondary | ICD-10-CM | POA: Diagnosis not present

## 2019-06-08 NOTE — Progress Notes (Addendum)
Laurie MuttonBALDWIN, Laurie S. ([id number]) Visit Report for 06/08/2019 Chief Complaint Document Details Patient Name: Laurie MuttonBALDWIN, Laurie S. Date of Service: 06/08/2019 2:00 PM Medical Record Number: [id number] Patient Account Number: [id number] Date of Birth/Sex: 03/29/1951 (68 y.o. F) Treating RN: Curtis Sitesorthy, Joanna Primary Care Provider: Einar CrowAnderson, Marshall Other Clinician: Referring Provider: Einar CrowAnderson, Marshall Treating Provider/Extender: Linwood DibblesSTONE III, HOYT Weeks in Treatment: 2 Information Obtained from: Patient Chief Complaint Bilateral LE Ulcers and lymphedema Electronic Signature(s) Signed: 06/08/2019 2:20:51 PM By: Lenda KelpStone III, Hoyt PA-C Entered By: Lenda KelpStone III, Hoyt on 06/08/2019 14:20:51 Laurie MuttonBALDWIN, Laurie S. ([id number]) -------------------------------------------------------------------------------- HPI Details Patient Name: Laurie MuttonBALDWIN, Laurie S. Date of Service: 06/08/2019 2:00 PM Medical Record Number: [id number] Patient Account Number: [id number] Date of Birth/Sex: 07/17/1951 (68 y.o. F) Treating RN: Curtis Sitesorthy, Joanna Primary Care Provider: Einar CrowAnderson, Marshall Other Clinician: Referring Provider: Einar CrowAnderson, Marshall Treating Provider/Extender: Linwood DibblesSTONE III, HOYT Weeks in Treatment: 2 History of Present Illness HPI Description: 05/25/2019 on evaluation today patient presents for evaluation here in the clinic concerning issues that she has been having for the past 3 weeks with her bilateral lower extremities. She tells me that she has been tolerating utilizing the lymphedema pumps for about 2 hours in the middle the night around 1 AM when she wakes up she typically goes to bed early around 6 wakes up in the middle the night uses her pumps that is only been more recent. Though she feels like it is aggravating her legs to some degree. She is not been using any dressings on her legs at this point. Fortunately there is no signs of active infection either at this time though she has been placed on Keflex I do not see any  evidence of cellulitis I will have her complete that nonetheless. With regard to her treatment she has been prescribed torsemide along with potassium just by her primary care provider recently. She tells me that she has been taking those but was just given them again earlier this week. Obviously that can help some with her edema control. She has not been wearing any compression stockings he tells me that she used to have some but they were too small and she can never get those on. Fortunately there is no signs of active infection at this time locally or systemically. No fever chills noted. She is a former smoker but quit 10 years ago. I do believe that the patient is in require some compression to try to get her edema under control. She tells me that she does sleep in the bed and when she is sitting on the couch or so long she is elevating her legs according to what she tells me. 05/31/2019 on evaluation today patient actually is here today for an early visit by 1 day compared to when she was supposed to be coming in tomorrow. Subsequently she actually has her arterial study with vascular tomorrow at 3 PM. For that reason we probably will not rewrap her today but will rather use Tubigrip so that she will be able to take this off for her test. Fortunately her wounds actually seem to be doing much better in fact I think she is very close to complete resolution as it is. I discussed with her today that once everything is healed that we likely will make referral to lymphedema clinic for her to aid in managing her lymphedema in regard to wrapping. 06/08/2019 on evaluation today patient actually appears to be completely healed which is great news. Fortunately she is having no evidence of edema and overall I  am extremely happy with how things have progressed. She does have her Velcro compression wraps which should be here sometime next week. With that being said we just need to would likely see her back in  order to show her how to apply those wraps and then she will be ready for complete discharge. Electronic Signature(s) Signed: 06/08/2019 4:49:57 PM By: Lenda Kelp PA-C Entered By: Lenda Kelp on 06/08/2019 16:49:57 Laurie, Dickson ([id number]) -------------------------------------------------------------------------------- Physical Exam Details Patient Name: Laurie Dickson Date of Service: 06/08/2019 2:00 PM Medical Record Number: [id number] Patient Account Number: [id number] Date of Birth/Sex: [birth year] (68 y.o. F) Treating RN: Curtis Sites Primary Care Provider: Einar Crow Other Clinician: Referring Provider: Einar Crow Treating Provider/Extender: STONE III, HOYT Weeks in Treatment: 2 Constitutional Well-nourished and well-hydrated in no acute distress. Respiratory normal breathing without difficulty. clear to auscultation bilaterally. Cardiovascular regular rate and rhythm with normal S1, S2. Psychiatric this patient is able to make decisions and demonstrates good insight into disease process. Alert and Oriented x 3. pleasant and cooperative. Notes Patient's wound bed currently showed signs of good she does not seem to be showing any signs of weeping and in fact that her nurse visit really did not show signs of much either. Epithelization at this point. There does not appear to be any evidence of infection and overall I am extremely happy with how the patient has progressed up to this time. Electronic Signature(s) Signed: 06/08/2019 4:50:31 PM By: Lenda Kelp PA-C Entered By: Lenda Kelp on 06/08/2019 16:50:31 Laurie, Dickson ([id number]) -------------------------------------------------------------------------------- Physician Orders Details Patient Name: Laurie Dickson Date of Service: 06/08/2019 2:00 PM Medical Record Number: [id number] Patient Account Number: [id number] Date of Birth/Sex: 05/13/1951 (68 y.o. F) Treating RN: Curtis Sites Primary Care Provider: Einar Crow Other Clinician: Referring Provider: Einar Crow Treating Provider/Extender: Linwood Dibbles, HOYT Weeks in Treatment: 2 Verbal / Phone Orders: No Diagnosis Coding ICD-10 Coding Code Description I89.0 Lymphedema, not elsewhere classified L97.822 Non-pressure chronic ulcer of other part of left lower leg with fat layer exposed L97.812 Non-pressure chronic ulcer of other part of right lower leg with fat layer exposed E11.622 Type 2 diabetes mellitus with other skin ulcer I10 Essential (primary) hypertension Follow-up Appointments o Return Appointment in 1 week. - 06/12/19 Edema Control o Patient to wear own Velcro compression garment. o Compression Pump: Use compression pump on left lower extremity for 60 minutes, twice daily. o Compression Pump: Use compression pump on right lower extremity for 60 minutes, twice daily. o Other: - TubiGrip G until Rosamaria Lints arrive [employer]) Signed: 06/08/2019 4:36:34 PM By: Curtis Sites Signed: 06/08/2019 5:06:41 PM By: Lenda Kelp PA-C Entered By: Curtis Sites on 06/08/2019 14:40:12 Laurie, Dickson ([id number]) -------------------------------------------------------------------------------- Problem List Details Patient Name: Laurie Dickson Date of Service: 06/08/2019 2:00 PM Medical Record Number: [id number] Patient Account Number: [id number] Date of Birth/Sex: 11-23-1950 (68 y.o. F) Treating RN: Curtis Sites Primary Care Provider: Einar Crow Other Clinician: Referring Provider: Einar Crow Treating Provider/Extender: Linwood Dibbles, HOYT Weeks in Treatment: 2 Active Problems ICD-10 Evaluated Encounter Code Description Active Date Today Diagnosis I89.0 Lymphedema, not elsewhere classified 05/25/2019 No Yes L97.822 Non-pressure chronic ulcer of other part of left lower leg with 05/25/2019 No Yes fat layer exposed L97.812 Non-pressure chronic ulcer  of other part of right lower leg 05/25/2019 No Yes with fat layer exposed E11.622 Type 2 diabetes mellitus with other skin ulcer 05/25/2019 No Yes I10 Essential (primary) hypertension 05/25/2019 No Yes  Inactive Problems Resolved Problems Electronic Signature(s) Signed: 06/08/2019 2:20:45 PM By: Worthy Keeler PA-C Entered By: Worthy Keeler on 06/08/2019 14:20:45 Laurie Dickson ([id number]) -------------------------------------------------------------------------------- Progress Note Details Patient Name: Laurie Dickson Date of Service: 06/08/2019 2:00 PM Medical Record Number: [id number] Patient Account Number: [id number] Date of Birth/Sex: 09-Jul-1951 (68 y.o. F) Treating RN: Montey Hora Primary Care Provider: Frazier Richards Other Clinician: Referring Provider: Frazier Richards Treating Provider/Extender: Melburn Hake, HOYT Weeks in Treatment: 2 Subjective Chief Complaint Information obtained from Patient Bilateral LE Ulcers and lymphedema History of Present Illness (HPI) 05/25/2019 on evaluation today patient presents for evaluation here in the clinic concerning issues that she has been having for the past 3 weeks with her bilateral lower extremities. She tells me that she has been tolerating utilizing the lymphedema pumps for about 2 hours in the middle the night around 1 AM when she wakes up she typically goes to bed early around 6 wakes up in the middle the night uses her pumps that is only been more recent. Though she feels like it is aggravating her legs to some degree. She is not been using any dressings on her legs at this point. Fortunately there is no signs of active infection either at this time though she has been placed on Keflex I do not see any evidence of cellulitis I will have her complete that nonetheless. With regard to her treatment she has been prescribed torsemide along with potassium just by her primary care provider recently. She tells me that she has  been taking those but was just given them again earlier this week. Obviously that can help some with her edema control. She has not been wearing any compression stockings he tells me that she used to have some but they were too small and she can never get those on. Fortunately there is no signs of active infection at this time locally or systemically. No fever chills noted. She is a former smoker but quit 10 years ago. I do believe that the patient is in require some compression to try to get her edema under control. She tells me that she does sleep in the bed and when she is sitting on the couch or so long she is elevating her legs according to what she tells me. 05/31/2019 on evaluation today patient actually is here today for an early visit by 1 day compared to when she was supposed to be coming in tomorrow. Subsequently she actually has her arterial study with vascular tomorrow at 3 PM. For that reason we probably will not rewrap her today but will rather use Tubigrip so that she will be able to take this off for her test. Fortunately her wounds actually seem to be doing much better in fact I think she is very close to complete resolution as it is. I discussed with her today that once everything is healed that we likely will make referral to lymphedema clinic for her to aid in managing her lymphedema in regard to wrapping. 06/08/2019 on evaluation today patient actually appears to be completely healed which is great news. Fortunately she is having no evidence of edema and overall I am extremely happy with how things have progressed. She does have her Velcro compression wraps which should be here sometime next week. With that being said we just need to would likely see her back in order to show her how to apply those wraps and then she will be ready for complete discharge. Patient History Information  obtained from Patient. Family History Heart Disease - Father, Kidney Disease - Father, No family  history of Cancer, Diabetes, Hereditary Spherocytosis, Hypertension, Lung Disease, Seizures, Stroke, Thyroid Problems, Tuberculosis. Social History Former smoker - 20 years - ended on 08/30/2008, Marital Status - Married, Alcohol Use - Never, Drug Use - No History, Caffeine Use - Daily - tea. Medical History Eyes AUDRIANA, ALDAMA ([id number]) Denies history of Cataracts, Glaucoma, Optic Neuritis Ear/Nose/Mouth/Throat Denies history of Chronic sinus problems/congestion, Middle ear problems Hematologic/Lymphatic Denies history of Anemia, Hemophilia, Human Immunodeficiency Virus, Lymphedema, Sickle Cell Disease Respiratory Denies history of Aspiration, Asthma, Chronic Obstructive Pulmonary Disease (COPD), Pneumothorax, Sleep Apnea, Tuberculosis Cardiovascular Patient has history of Hypertension Denies history of Angina, Arrhythmia, Congestive Heart Failure, Coronary Artery Disease, Deep Vein Thrombosis, Hypotension, Myocardial Infarction, Peripheral Arterial Disease, Peripheral Venous Disease, Phlebitis, Vasculitis Gastrointestinal Denies history of Cirrhosis , Colitis, Crohn s, Hepatitis A, Hepatitis B, Hepatitis C Endocrine Patient has history of Type II Diabetes - past, not anymore Genitourinary Denies history of End Stage Renal Disease Immunological Denies history of Lupus Erythematosus, Raynaud s, Scleroderma Integumentary (Skin) Denies history of History of Burn, History of pressure wounds Musculoskeletal Denies history of Gout, Rheumatoid Arthritis, Osteoarthritis, Osteomyelitis Neurologic Denies history of Dementia, Neuropathy, Quadriplegia, Paraplegia, Seizure Disorder Oncologic Denies history of Received Chemotherapy, Received Radiation Psychiatric Denies history of Anorexia/bulimia, Confinement Anxiety Review of Systems (ROS) Constitutional Symptoms (General Health) Denies complaints or symptoms of Fatigue, Fever, Chills, Marked Weight Change. Respiratory Denies  complaints or symptoms of Chronic or frequent coughs, Shortness of Breath. Cardiovascular Complains or has symptoms of LE edema. Denies complaints or symptoms of Chest pain. Psychiatric Denies complaints or symptoms of Anxiety, Claustrophobia. Objective Constitutional Well-nourished and well-hydrated in no acute distress. Vitals Time Taken: 1:58 PM, Height: 64 in, Weight: 280 lbs, BMI: 48.1, Temperature: 98.7 F, Pulse: 69 bpm, Respiratory Rate: 16 breaths/min, Blood Pressure: 159/55 mmHg. Laurie, Dickson ([id number]) Respiratory normal breathing without difficulty. clear to auscultation bilaterally. Cardiovascular regular rate and rhythm with normal S1, S2. Psychiatric this patient is able to make decisions and demonstrates good insight into disease process. Alert and Oriented x 3. pleasant and cooperative. General Notes: Patient's wound bed currently showed signs of good she does not seem to be showing any signs of weeping and in fact that her nurse visit really did not show signs of much either. Epithelization at this point. There does not appear to be any evidence of infection and overall I am extremely happy with how the patient has progressed up to this time. Integumentary (Hair, Skin) Wound #1 status is Healed - Epithelialized. Original cause of wound was Gradually Appeared. The wound is located on the Left,Anterior Lower Leg. The wound measures 0cm length x 0cm width x 0cm depth; 0cm^2 area and 0cm^3 volume. The wound is limited to skin breakdown. There is no tunneling or undermining noted. There is a none present amount of drainage noted. The wound margin is flat and intact. There is no granulation within the wound bed. There is no necrotic tissue within the wound bed. Wound #2 status is Healed - Epithelialized. Original cause of wound was Gradually Appeared. The wound is located on the Right,Anterior Lower Leg. The wound measures 0cm length x 0cm width x 0cm depth; 0cm^2 area  and 0cm^3 volume. The wound is limited to skin breakdown. There is no tunneling or undermining noted. There is a none present amount of drainage noted. The wound margin is flat and intact. There is no granulation within  the wound bed. There is no necrotic tissue within the wound bed. Assessment Active Problems ICD-10 Lymphedema, not elsewhere classified Non-pressure chronic ulcer of other part of left lower leg with fat layer exposed Non-pressure chronic ulcer of other part of right lower leg with fat layer exposed Type 2 diabetes mellitus with other skin ulcer Essential (primary) hypertension Plan Follow-up Appointments: Return Appointment in 1 week. - 06/12/19 Edema Control: Patient to wear own Velcro compression garment. Compression Pump: Use compression pump on left lower extremity for 60 minutes, twice daily. Compression Pump: Use compression pump on right lower extremity for 60 minutes, twice daily. Other: - TubiGrip G until [employer] arrive Laurie, Dickson ([id number]) 1. My suggestion currently is going to be that we go ahead and continue with the current wound care measures for the next week as far as monitoring for any weeping is concerned we will use Tubigrip for the time being. 2. She should have her Velcro compression garments by next week at which point we will be able to see her and then subsequently discharge her at that point. My hope is she would not have anything weeping in between now and then. We will see patient back for reevaluation in 1 week here in the clinic. If anything worsens or changes patient will contact our office for additional recommendations. Electronic Signature(s) Signed: 06/08/2019 4:51:11 PM By: Lenda Kelp PA-C Entered By: Lenda Kelp on 06/08/2019 16:51:11 Laurie, Dickson ([id number]) -------------------------------------------------------------------------------- ROS/PFSH Details Patient Name: Laurie Dickson Date of Service:  06/08/2019 2:00 PM Medical Record Number: [id number] Patient Account Number: [id number] Date of Birth/Sex: 10-20-1950 (68 y.o. F) Treating RN: Curtis Sites Primary Care Provider: Einar Crow Other Clinician: Referring Provider: Einar Crow Treating Provider/Extender: Linwood Dibbles, HOYT Weeks in Treatment: 2 Information Obtained From Patient Constitutional Symptoms (General Health) Complaints and Symptoms: Negative for: Fatigue; Fever; Chills; Marked Weight Change Respiratory Complaints and Symptoms: Negative for: Chronic or frequent coughs; Shortness of Breath Medical History: Negative for: Aspiration; Asthma; Chronic Obstructive Pulmonary Disease (COPD); Pneumothorax; Sleep Apnea; Tuberculosis Cardiovascular Complaints and Symptoms: Positive for: LE edema Negative for: Chest pain Medical History: Positive for: Hypertension Negative for: Angina; Arrhythmia; Congestive Heart Failure; Coronary Artery Disease; Deep Vein Thrombosis; Hypotension; Myocardial Infarction; Peripheral Arterial Disease; Peripheral Venous Disease; Phlebitis; Vasculitis Psychiatric Complaints and Symptoms: Negative for: Anxiety; Claustrophobia Medical History: Negative for: Anorexia/bulimia; Confinement Anxiety Eyes Medical History: Negative for: Cataracts; Glaucoma; Optic Neuritis Ear/Nose/Mouth/Throat Medical History: Negative for: Chronic sinus problems/congestion; Middle ear problems Hematologic/Lymphatic Medical History: Negative for: Anemia; Hemophilia; Human Immunodeficiency Virus; Lymphedema; Sickle Cell Disease Laurie, Dickson ([id number]) Gastrointestinal Medical History: Negative for: Cirrhosis ; Colitis; Crohnos; Hepatitis A; Hepatitis B; Hepatitis C Endocrine Medical History: Positive for: Type II Diabetes - past, not anymore Genitourinary Medical History: Negative for: End Stage Renal Disease Immunological Medical History: Negative for: Lupus Erythematosus; Raynaudos;  Scleroderma Integumentary (Skin) Medical History: Negative for: History of Burn; History of pressure wounds Musculoskeletal Medical History: Negative for: Gout; Rheumatoid Arthritis; Osteoarthritis; Osteomyelitis Neurologic Medical History: Negative for: Dementia; Neuropathy; Quadriplegia; Paraplegia; Seizure Disorder Oncologic Medical History: Negative for: Received Chemotherapy; Received Radiation Immunizations Pneumococcal Vaccine: Received Pneumococcal Vaccination: No Implantable Devices None Family and Social History Cancer: No; Diabetes: No; Heart Disease: Yes - Father; Hereditary Spherocytosis: No; Hypertension: No; Kidney Disease: Yes - Father; Lung Disease: No; Seizures: No; Stroke: No; Thyroid Problems: No; Tuberculosis: No; Former smoker - 20 years - ended on 08/30/2008; Marital Status - Married; Alcohol Use: Never; Drug Use: No  History; Caffeine Use: Daily - tea; Financial Concerns: No; Food, Clothing or Shelter Needs: No; Support System Lacking: No; Transportation Concerns: No Physician Affirmation I have reviewed and agree with the above information. Electronic Signature(s) Laurie, Dickson ([id number]) Signed: 06/08/2019 5:06:41 PM By: Lenda Kelp PA-C Signed: 06/11/2019 5:14:47 PM By: Curtis Sites Entered By: Lenda Kelp on 06/08/2019 16:50:21 Laurie, Dickson ([id number]) -------------------------------------------------------------------------------- SuperBill Details Patient Name: Laurie Dickson Date of Service: 06/08/2019 Medical Record Number: [id number] Patient Account Number: [id number] Date of Birth/Sex: 09-08-1950 (68 y.o. F) Treating RN: Curtis Sites Primary Care Provider: Einar Crow Other Clinician: Referring Provider: Einar Crow Treating Provider/Extender: Linwood Dibbles, HOYT Weeks in Treatment: 2 Diagnosis Coding ICD-10 Codes Code Description I89.0 Lymphedema, not elsewhere classified L97.822 Non-pressure chronic ulcer of  other part of left lower leg with fat layer exposed L97.812 Non-pressure chronic ulcer of other part of right lower leg with fat layer exposed E11.622 Type 2 diabetes mellitus with other skin ulcer I10 Essential (primary) hypertension Facility Procedures CPT4 Code: [id number] Description: 99213 - WOUND CARE VISIT-LEV 3 EST PT Modifier: Quantity: 1 Physician Procedures CPT4 Code Description: [id number] 99213 - WC PHYS LEVEL 3 - EST PT ICD-10 Diagnosis Description I89.0 Lymphedema, not elsewhere classified L97.822 Non-pressure chronic ulcer of other part of left lower leg wit L97.812 Non-pressure chronic ulcer of other  part of right lower leg wi E11.622 Type 2 diabetes mellitus with other skin ulcer Modifier: h fat layer expos th fat layer expo Quantity: 1 ed sed Electronic Signature(s) Signed: 06/08/2019 4:51:23 PM By: Lenda Kelp PA-C Entered By: Lenda Kelp on 06/08/2019 16:51:23

## 2019-06-12 ENCOUNTER — Other Ambulatory Visit: Payer: Self-pay

## 2019-06-12 ENCOUNTER — Encounter: Payer: Managed Care, Other (non HMO) | Admitting: Physician Assistant

## 2019-06-12 DIAGNOSIS — I890 Lymphedema, not elsewhere classified: Secondary | ICD-10-CM | POA: Diagnosis not present

## 2019-06-13 NOTE — Progress Notes (Addendum)
Laurie Dickson, Monik S. ([id number]) Visit Report for 06/12/2019 Arrival Information Details Patient Name: Laurie Dickson, Laurie S. Date of Service: 06/12/2019 4:00 PM Medical Record Number: [id number] Patient Account Number: [id number] Date of Birth/Sex: 04/21/1951 (68 y.o. F) Treating RN: Curtis Sitesorthy, Joanna Primary Care Adeline Petitfrere: Einar CrowAnderson, Marshall Other Clinician: Referring Alpha Chouinard: Einar CrowAnderson, Marshall Treating Teiana Hajduk/Extender: Linwood DibblesSTONE III, HOYT Weeks in Treatment: 2 Visit Information History Since Last Visit Added or deleted any medications: No Patient Arrived: Ambulatory Any new allergies or adverse reactions: No Arrival Time: 16:35 Had a fall or experienced change in No Accompanied By: husband activities of daily living that may affect Transfer Assistance: None risk of falls: Patient Identification Verified: Yes Signs or symptoms of abuse/neglect since last visito No Secondary Verification Process Completed: Yes Hospitalized since last visit: No Has Dressing in Place as Prescribed: Yes Pain Present Now: No Electronic Signature(s) Signed: 06/12/2019 4:59:02 PM By: Dayton MartesWallace, RCP,RRT,CHT, Sallie RCP, RRT, CHT Entered By: Dayton MartesWallace, RCP,RRT,CHT, Sallie on 06/12/2019 16:36:45 Laurie Dickson, Laurie S. ([id number]) -------------------------------------------------------------------------------- Clinic Level of Care Assessment Details Patient Name: Laurie Dickson, Laurie S. Date of Service: 06/12/2019 4:00 PM Medical Record Number: [id number] Patient Account Number: [id number] Date of Birth/Sex: 05/19/1951 (68 y.o. F) Treating RN: Curtis Sitesorthy, Joanna Primary Care Dayten Juba: Einar CrowAnderson, Marshall Other Clinician: Referring Jaanvi Fizer: Einar CrowAnderson, Marshall Treating Jamise Pentland/Extender: Linwood DibblesSTONE III, HOYT Weeks in Treatment: 2 Clinic Level of Care Assessment Items TOOL 4 Quantity Score []  - Use when only an EandM is performed on FOLLOW-UP visit 0 ASSESSMENTS - Nursing Assessment / Reassessment X - Reassessment of Co-morbidities  (includes updates in patient status) 1 10 X- 1 5 Reassessment of Adherence to Treatment Plan ASSESSMENTS - Wound and Skin Assessment / Reassessment []  - Simple Wound Assessment / Reassessment - one wound 0 []  - 0 Complex Wound Assessment / Reassessment - multiple wounds []  - 0 Dermatologic / Skin Assessment (not related to wound area) ASSESSMENTS - Focused Assessment []  - Circumferential Edema Measurements - multi extremities 0 []  - 0 Nutritional Assessment / Counseling / Intervention X- 1 5 Lower Extremity Assessment (monofilament, tuning fork, pulses) []  - 0 Peripheral Arterial Disease Assessment (using hand held doppler) ASSESSMENTS - Ostomy and/or Continence Assessment and Care []  - Incontinence Assessment and Management 0 []  - 0 Ostomy Care Assessment and Management (repouching, etc.) PROCESS - Coordination of Care X - Simple Patient / Family Education for ongoing care 1 15 []  - 0 Complex (extensive) Patient / Family Education for ongoing care X- 1 10 Staff obtains ChiropractorConsents, Records, Test Results / Process Orders []  - 0 Staff telephones HHA, Nursing Homes / Clarify orders / etc []  - 0 Routine Transfer to another Facility (non-emergent condition) []  - 0 Routine Hospital Admission (non-emergent condition) []  - 0 New Admissions / Manufacturing engineernsurance Authorizations / Ordering NPWT, Apligraf, etc. []  - 0 Emergency Hospital Admission (emergent condition) X- 1 10 Simple Discharge Coordination Laurie Dickson, Laurie S. ([id number]) []  - 0 Complex (extensive) Discharge Coordination PROCESS - Special Needs []  - Pediatric / Minor Patient Management 0 []  - 0 Isolation Patient Management []  - 0 Hearing / Language / Visual special needs []  - 0 Assessment of Community assistance (transportation, D/C planning, etc.) []  - 0 Additional assistance / Altered mentation []  - 0 Support Surface(s) Assessment (bed, cushion, seat, etc.) INTERVENTIONS - Wound Cleansing / Measurement []  - Simple Wound  Cleansing - one wound 0 []  - 0 Complex Wound Cleansing - multiple wounds []  - 0 Wound Imaging (photographs - any number of wounds) []  - 0 Wound Tracing (instead of photographs) []  -  0 Simple Wound Measurement - one wound []  - 0 Complex Wound Measurement - multiple wounds INTERVENTIONS - Wound Dressings []  - Small Wound Dressing one or multiple wounds 0 []  - 0 Medium Wound Dressing one or multiple wounds []  - 0 Large Wound Dressing one or multiple wounds []  - 0 Application of Medications - topical []  - 0 Application of Medications - injection INTERVENTIONS - Miscellaneous []  - External ear exam 0 []  - 0 Specimen Collection (cultures, biopsies, blood, body fluids, etc.) []  - 0 Specimen(s) / Culture(s) sent or taken to Lab for analysis []  - 0 Patient Transfer (multiple staff / Civil Service fast streamer / Similar devices) []  - 0 Simple Staple / Suture removal (25 or less) []  - 0 Complex Staple / Suture removal (26 or more) []  - 0 Hypo / Hyperglycemic Management (close monitor of Blood Glucose) []  - 0 Ankle / Brachial Index (ABI) - do not check if billed separately X- 1 5 Vital Signs Laurie Dickson, Laurie S. ([id number]) Has the patient been seen at the hospital within the last three years: Yes Total Score: 60 Level Of Care: New/Established - Level 2 Electronic Signature(s) Signed: 06/12/2019 5:32:28 PM By: Montey Hora Entered By: Montey Hora on 06/12/2019 16:52:02 Laurie Dickson ([id number]) -------------------------------------------------------------------------------- Encounter Discharge Information Details Patient Name: Laurie Dickson Date of Service: 06/12/2019 4:00 PM Medical Record Number: [id number] Patient Account Number: [id number] Date of Birth/Sex: September 30, 1950 (68 y.o. F) Treating RN: Montey Hora Primary Care Nitesh Pitstick: Frazier Richards Other Clinician: Referring Erlinda Solinger: Frazier Richards Treating Famous Eisenhardt/Extender: Melburn Hake, HOYT Weeks in Treatment:  2 Encounter Discharge Information Items Discharge Condition: Stable Ambulatory Status: Ambulatory Discharge Destination: Home Transportation: Private Auto Accompanied By: husband Schedule Follow-up Appointment: No Clinical Summary of Care: Electronic Signature(s) Signed: 06/12/2019 5:32:28 PM By: Montey Hora Entered By: Montey Hora on 06/12/2019 16:53:09 Laurie Dickson ([id number]) -------------------------------------------------------------------------------- Lower Extremity Assessment Details Patient Name: Laurie Dickson Date of Service: 06/12/2019 4:00 PM Medical Record Number: [id number] Patient Account Number: [id number] Date of Birth/Sex: 02/04/1951 (68 y.o. F) Treating RN: Harold Barban Primary Care Taleyah Hillman: Frazier Richards Other Clinician: Referring Sumaiyah Markert: Frazier Richards Treating Shervin Cypert/Extender: Melburn Hake, HOYT Weeks in Treatment: 2 Edema Assessment Assessed: [Left: No] [Right: No] [Left: Edema] [Right: :] Calf Left: Right: Point of Measurement: 32 cm From Medial Instep 62 cm 60.5 cm Ankle Left: Right: Point of Measurement: 9 cm From Medial Instep 40.5 cm 38.7 cm Vascular Assessment Pulses: Dorsalis Pedis Palpable: [Left:Yes] [Right:Yes] Posterior Tibial Palpable: [Left:Yes] [Right:Yes] Electronic Signature(s) Signed: 06/13/2019 4:37:37 PM By: Harold Barban Entered By: Harold Barban on 06/12/2019 16:44:50 Laurie Dickson ([id number]) -------------------------------------------------------------------------------- Pain Assessment Details Patient Name: Laurie Dickson Date of Service: 06/12/2019 4:00 PM Medical Record Number: [id number] Patient Account Number: [id number] Date of Birth/Sex: [birth year] (68 y.o. F) Treating RN: Montey Hora Primary Care Aayana Reinertsen: Frazier Richards Other Clinician: Referring Starlet Gallentine: Frazier Richards Treating Vibhav Waddill/Extender: Melburn Hake, HOYT Weeks in Treatment: 2 Active Problems Location of  Pain Severity and Description of Pain Patient Has Paino No Site Locations Pain Management and Medication Current Pain Management: Electronic Signature(s) Signed: 06/12/2019 4:59:02 PM By: Paulla Fore, RRT, CHT Signed: 06/12/2019 5:32:28 PM By: Montey Hora Entered By: Lorine Bears on 06/12/2019 16:36:53 Laurie Dickson ([id number]) -------------------------------------------------------------------------------- Patient/Caregiver Education Details Patient Name: Laurie Dickson Date of Service: 06/12/2019 4:00 PM Medical Record Number: [id number] Patient Account Number: [id number] Date of Birth/Gender: [birth year] (68 y.o. F) Treating RN: Montey Hora Primary Care Physician: Frazier Richards Other Clinician: Referring Physician: Ouida Sills,  Gaynell Face Treating Physician/Extender: Skeet Simmer in Treatment: 2 Education Assessment Education Provided To: Patient and Caregiver Education Topics Provided Venous: Handouts: Controlling Swelling with Multilayered Compression Wraps Methods: Explain/Verbal Responses: State content correctly Electronic Signature(s) Signed: 06/12/2019 5:32:28 PM By: Curtis Sites Entered By: Curtis Sites on 06/12/2019 16:52:49 Laurie Mutton ([id number]) -------------------------------------------------------------------------------- Vitals Details Patient Name: Laurie Mutton Date of Service: 06/12/2019 4:00 PM Medical Record Number: [id number] Patient Account Number: [id number] Date of Birth/Sex: [birth year] (68 y.o. F) Treating RN: Curtis Sites Primary Care Savi Lastinger: Einar Crow Other Clinician: Referring Chrisha Vogel: Einar Crow Treating Dezarai Prew/Extender: Linwood Dibbles, HOYT Weeks in Treatment: 2 Vital Signs Time Taken: 16:36 Temperature (F): 98.4 Height (in): 64 Pulse (bpm): 61 Weight (lbs): 280 Respiratory Rate (breaths/min): 16 Body Mass Index (BMI): 48.1 Blood Pressure (mmHg):  150/71 Reference Range: 80 - 120 mg / dl Electronic Signature(s) Signed: 06/12/2019 4:59:02 PM By: Dayton Martes RCP, RRT, CHT Entered By: Dayton Martes on 06/12/2019 16:37:26

## 2019-06-13 NOTE — Progress Notes (Addendum)
Laurie Dickson, Laurie Dickson ([id number]) Visit Report for 06/12/2019 Chief Complaint Document Details Patient Name: Laurie Dickson, Laurie Dickson Date of Service: 06/12/2019 4:00 PM Medical Record Number: [id number] Patient Account Number: [id number] Date of Birth/Sex: 11/13/1950 (68 y.o. F) Treating RN: Curtis Sites Primary Care Provider: Einar Crow Other Clinician: Referring Provider: Einar Crow Treating Provider/Extender: Linwood Dibbles, Jassiel Flye Weeks in Treatment: 2 Information Obtained from: Patient Chief Complaint Bilateral LE Ulcers and lymphedema Electronic Signature(s) Signed: 06/12/2019 4:13:58 PM By: Lenda Kelp PA-C Entered By: Lenda Kelp on 06/12/2019 16:13:57 Laurie Dickson ([id number]) -------------------------------------------------------------------------------- HPI Details Patient Name: Laurie Dickson Date of Service: 06/12/2019 4:00 PM Medical Record Number: [id number] Patient Account Number: [id number] Date of Birth/Sex: November 08, 1950 (68 y.o. F) Treating RN: Curtis Sites Primary Care Provider: Einar Crow Other Clinician: Referring Provider: Einar Crow Treating Provider/Extender: Linwood Dibbles, Ram Haugan Weeks in Treatment: 2 History of Present Illness HPI Description: 05/25/2019 on evaluation today patient presents for evaluation here in the clinic concerning issues that she has been having for the past 3 weeks with her bilateral lower extremities. She tells me that she has been tolerating utilizing the lymphedema pumps for about 2 hours in the middle the night around 1 AM when she wakes up she typically goes to bed early around 6 wakes up in the middle the night uses her pumps that is only been more recent. Though she feels like it is aggravating her legs to some degree. She is not been using any dressings on her legs at this point. Fortunately there is no signs of active infection either at this time though she has been placed on Keflex I do not see any  evidence of cellulitis I will have her complete that nonetheless. With regard to her treatment she has been prescribed torsemide along with potassium just by her primary care provider recently. She tells me that she has been taking those but was just given them again earlier this week. Obviously that can help some with her edema control. She has not been wearing any compression stockings he tells me that she used to have some but they were too small and she can never get those on. Fortunately there is no signs of active infection at this time locally or systemically. No fever chills noted. She is a former smoker but quit 10 years ago. I do believe that the patient is in require some compression to try to get her edema under control. She tells me that she does sleep in the bed and when she is sitting on the couch or so long she is elevating her legs according to what she tells me. 05/31/2019 on evaluation today patient actually is here today for an early visit by 1 day compared to when she was supposed to be coming in tomorrow. Subsequently she actually has her arterial study with vascular tomorrow at 3 PM. For that reason we probably will not rewrap her today but will rather use Tubigrip so that she will be able to take this off for her test. Fortunately her wounds actually seem to be doing much better in fact I think she is very close to complete resolution as it is. I discussed with her today that once everything is healed that we likely will make referral to lymphedema clinic for her to aid in managing her lymphedema in regard to wrapping. 06/08/2019 on evaluation today patient actually appears to be completely healed which is great news. Fortunately she is having no evidence of edema and overall I  am extremely happy with how things have progressed. She does have her Velcro compression wraps which should be here sometime next week. With that being said we just need to would likely see her back in  order to show her how to apply those wraps and then she will be ready for complete discharge. 06/12/2019 on evaluation today patient appears to be doing excellent she still has no open wounds at this time she did get her Velcro compression wraps which are available for us to applied today. I think she is ready for discharge based on what I am seeing. Electronic Signature(s) Signed: 06/13/2019 6:12:50 PM By: Lenda KelpStone III, Aidenn Skellenger PA-C Entered By: Lenda KelpStone III, Shayanna Thatch on 06/13/2019 12:53:40 Laurie Dickson, Laurie S. ([id number]) -------------------------------------------------------------------------------- Physical Exam Details Patient Name: Laurie Dickson, Laurie S. Date of Service: 06/12/2019 4:00 PM Medical Record Number: [id number] Patient Account Number: [id number] Date of Birth/Sex: 01/30/1951 (68 y.o. F) Treating RN: Curtis Sitesorthy, Joanna Primary Care Provider: Einar CrowAnderson, Marshall Other Clinician: Referring Provider: Einar CrowAnderson, Marshall Treating Provider/Extender: STONE III, Sharanya Templin Weeks in Treatment: 2 Constitutional Well-nourished and well-hydrated in no acute distress. Respiratory normal breathing without difficulty. Psychiatric this patient is able to make decisions and demonstrates good insight into disease process. Alert and Oriented x 3. pleasant and cooperative. Notes On inspection today patient's wounds again still are completely closed she has no weeping in regard to her lower extremities and overall she seems to be doing quite well. I am still pleased with where things stand she does have lymphedema but again hopefully the Velcro compression wraps will help prevent any additional opening. Electronic Signature(s) Signed: 06/13/2019 6:12:50 PM By: Lenda KelpStone III, Emile Kyllo PA-C Entered By: Lenda KelpStone III, Kamila Broda on 06/13/2019 12:54:17 Laurie Dickson, Laurie S. ([id number]) -------------------------------------------------------------------------------- Physician Orders Details Patient Name: Laurie Dickson, Laurie S. Date of Service:  06/12/2019 4:00 PM Medical Record Number: [id number] Patient Account Number: [id number] Date of Birth/Sex: 08/14/1951 (68 y.o. F) Treating RN: Curtis Sitesorthy, Joanna Primary Care Provider: Einar CrowAnderson, Marshall Other Clinician: Referring Provider: Einar CrowAnderson, Marshall Treating Provider/Extender: Linwood DibblesSTONE III, Johnhenry Tippin Weeks in Treatment: 2 Verbal / Phone Orders: No Diagnosis Coding ICD-10 Coding Code Description I89.0 Lymphedema, not elsewhere classified L97.822 Non-pressure chronic ulcer of other part of left lower leg with fat layer exposed L97.812 Non-pressure chronic ulcer of other part of right lower leg with fat layer exposed E11.622 Type 2 diabetes mellitus with other skin ulcer I10 Essential (primary) hypertension [facility]charge [facility] - Please continue to wear your velcro compression wraps and use your pumps every day. Electronic Signature(s) Signed: 06/12/2019 5:32:28 PM By: Curtis Sitesorthy, Joanna Signed: 06/13/2019 6:12:50 PM By: Lenda KelpStone III, Zeyna Mkrtchyan PA-C Entered By: Curtis Sitesorthy, Joanna on 06/12/2019 16:51:29 Laurie Dickson, Laurie S. ([id number]) -------------------------------------------------------------------------------- Problem List Details Patient Name: Laurie Dickson, Laurie S. Date of Service: 06/12/2019 4:00 PM Medical Record Number: [id number] Patient Account Number: [id number] Date of Birth/Sex: 04/08/1951 (68 y.o. F) Treating RN: Curtis Sitesorthy, Joanna Primary Care Provider: Einar CrowAnderson, Marshall Other Clinician: Referring Provider: Einar CrowAnderson, Marshall Treating Provider/Extender: Linwood DibblesSTONE III, Phi Avans Weeks in Treatment: 2 Active Problems ICD-10 Evaluated Encounter Code Description Active Date Today Diagnosis I89.0 Lymphedema, not elsewhere classified 05/25/2019 No Yes L97.822 Non-pressure chronic ulcer of other part of left lower leg with 05/25/2019 No Yes fat layer exposed L97.812 Non-pressure chronic ulcer of other part of right lower leg 05/25/2019 No Yes with fat layer  exposed E11.622 Type 2 diabetes mellitus with other skin ulcer 05/25/2019 No Yes I10 Essential (primary) hypertension 05/25/2019 No Yes Inactive Problems Resolved Problems Electronic Signature(s) Signed: 06/12/2019 4:13:51 PM  By: Lenda Kelp PA-C Entered By: Lenda Kelp on 06/12/2019 16:13:51 Laurie Dickson ([id number]) -------------------------------------------------------------------------------- Progress Note Details Patient Name: Laurie Dickson Date of Service: 06/12/2019 4:00 PM Medical Record Number: [id number] Patient Account Number: [id number] Date of Birth/Sex: 05/19/1951 (68 y.o. F) Treating RN: Curtis Sites Primary Care Provider: Einar Crow Other Clinician: Referring Provider: Einar Crow Treating Provider/Extender: Linwood Dibbles, Marysue Fait Weeks in Treatment: 2 Subjective Chief Complaint Information obtained from Patient Bilateral LE Ulcers and lymphedema History of Present Illness (HPI) 05/25/2019 on evaluation today patient presents for evaluation here in the clinic concerning issues that she has been having for the past 3 weeks with her bilateral lower extremities. She tells me that she has been tolerating utilizing the lymphedema pumps for about 2 hours in the middle the night around 1 AM when she wakes up she typically goes to bed early around 6 wakes up in the middle the night uses her pumps that is only been more recent. Though she feels like it is aggravating her legs to some degree. She is not been using any dressings on her legs at this point. Fortunately there is no signs of active infection either at this time though she has been placed on Keflex I do not see any evidence of cellulitis I will have her complete that nonetheless. With regard to her treatment she has been prescribed torsemide along with potassium just by her primary care provider recently. She tells me that she has been taking those but was just given them again earlier this  week. Obviously that can help some with her edema control. She has not been wearing any compression stockings he tells me that she used to have some but they were too small and she can never get those on. Fortunately there is no signs of active infection at this time locally or systemically. No fever chills noted. She is a former smoker but quit 10 years ago. I do believe that the patient is in require some compression to try to get her edema under control. She tells me that she does sleep in the bed and when she is sitting on the couch or so long she is elevating her legs according to what she tells me. 05/31/2019 on evaluation today patient actually is here today for an early visit by 1 day compared to when she was supposed to be coming in tomorrow. Subsequently she actually has her arterial study with vascular tomorrow at 3 PM. For that reason we probably will not rewrap her today but will rather use Tubigrip so that she will be able to take this off for her test. Fortunately her wounds actually seem to be doing much better in fact I think she is very close to complete resolution as it is. I discussed with her today that once everything is healed that we likely will make referral to lymphedema clinic for her to aid in managing her lymphedema in regard to wrapping. 06/08/2019 on evaluation today patient actually appears to be completely healed which is great news. Fortunately she is having no evidence of edema and overall I am extremely happy with how things have progressed. She does have her Velcro compression wraps which should be here sometime next week. With that being said we just need to would likely see her back in order to show her how to apply those wraps and then she will be ready for complete discharge. 06/12/2019 on evaluation today patient appears to be doing excellent she still has  no open wounds at this time she did get her Velcro compression wraps which are available for [place] to  applied today. I think she is ready for discharge based on what I am seeing. Patient History Information obtained from Patient. Family History Heart Disease - Father, Kidney Disease - Father, No family history of Cancer, Diabetes, Hereditary Spherocytosis, Hypertension, Lung Disease, Seizures, Stroke, Thyroid Problems, Tuberculosis. Social History Former smoker - 20 years - ended on 08/30/2008, Marital Status - Married, Alcohol Use - Never, Drug Use - No History, Laurie Dickson, Laurie S. ([id number]) Caffeine Use - Daily - tea. Medical History Eyes Denies history of Cataracts, Glaucoma, Optic Neuritis Ear/Nose/Mouth/Throat Denies history of Chronic sinus problems/congestion, Middle ear problems Hematologic/Lymphatic Denies history of Anemia, Hemophilia, Human Immunodeficiency Virus, Lymphedema, Sickle Cell Disease Respiratory Denies history of Aspiration, Asthma, Chronic Obstructive Pulmonary Disease (COPD), Pneumothorax, Sleep Apnea, Tuberculosis Cardiovascular Patient has history of Hypertension Denies history of Angina, Arrhythmia, Congestive Heart Failure, Coronary Artery Disease, Deep Vein Thrombosis, Hypotension, Myocardial Infarction, Peripheral Arterial Disease, Peripheral Venous Disease, Phlebitis, Vasculitis Gastrointestinal Denies history of Cirrhosis , Colitis, Crohn s, Hepatitis A, Hepatitis B, Hepatitis C Endocrine Patient has history of Type II Diabetes - past, not anymore Genitourinary Denies history of End Stage Renal Disease Immunological Denies history of Lupus Erythematosus, Raynaud s, Scleroderma Integumentary (Skin) Denies history of History of Burn, History of pressure wounds Musculoskeletal Denies history of Gout, Rheumatoid Arthritis, Osteoarthritis, Osteomyelitis Neurologic Denies history of Dementia, Neuropathy, Quadriplegia, Paraplegia, Seizure Disorder Oncologic Denies history of Received Chemotherapy, Received Radiation Psychiatric Denies history of  Anorexia/bulimia, Confinement Anxiety Review of Systems (ROS) Constitutional Symptoms (General Health) Denies complaints or symptoms of Fatigue, Fever, Chills, Marked Weight Change. Respiratory Denies complaints or symptoms of Chronic or frequent coughs, Shortness of Breath. Cardiovascular Complains or has symptoms of LE edema. Denies complaints or symptoms of Chest pain. Psychiatric Denies complaints or symptoms of Anxiety, Claustrophobia. Objective Constitutional Well-nourished and well-hydrated in no acute distress. Laurie Dickson, Laurie Dickson ([id number]) Vitals Time Taken: 4:36 PM, Height: 64 in, Weight: 280 lbs, BMI: 48.1, Temperature: 98.4 F, Pulse: 61 bpm, Respiratory Rate: 16 breaths/min, Blood Pressure: 150/71 mmHg. Respiratory normal breathing without difficulty. Psychiatric this patient is able to make decisions and demonstrates good insight into disease process. Alert and Oriented x 3. pleasant and cooperative. General Notes: On inspection today patient's wounds again still are completely closed she has no weeping in regard to her lower extremities and overall she seems to be doing quite well. I am still pleased with where things stand she does have lymphedema but again hopefully the Velcro compression wraps will help prevent any additional opening. Assessment Active Problems ICD-10 Lymphedema, not elsewhere classified Non-pressure chronic ulcer of other part of left lower leg with fat layer exposed Non-pressure chronic ulcer of other part of right lower leg with fat layer exposed Type 2 diabetes mellitus with other skin ulcer Essential (primary) hypertension Plan Discharge From [facility]: Discharge from Wound Care Center - Please continue to wear your velcro compression wraps and use your pumps every day. 1. I recommend that we go and discontinue wound care services at this point and the patient is in agreement with the plan. 2. I am also going to suggest at this time  that we go ahead and have her begin using the Velcro compression wraps for edema control and she is in agreement with that as well. This should be something that she puts on first thing in the morning and wears all day only to  take off at bedtime. 3. I do think that if she has any ongoing issues with lymphedema that is not managed well enough with the Velcro compression wraps we would make a referral to the lymphedema clinic but again right now I do not think that is necessary quite yet. Patient will follow-up as needed. Electronic Signature(s) Signed: 06/13/2019 6:12:50 PM By: Lieutenant Diego, Gerilyn Nestle ([id number]) Entered By: Lenda Kelp on 06/13/2019 12:55:18 Laurie Dickson, Laurie Dickson ([id number]) -------------------------------------------------------------------------------- ROS/PFSH Details Patient Name: Laurie Dickson Date of Service: 06/12/2019 4:00 PM Medical Record Number: [id number] Patient Account Number: [id number] Date of Birth/Sex: 1951-05-17 (68 y.o. F) Treating RN: Curtis Sites Primary Care Provider: Einar Crow Other Clinician: Referring Provider: Einar Crow Treating Provider/Extender: Linwood Dibbles, Porshia Blizzard Weeks in Treatment: 2 Information Obtained From Patient Constitutional Symptoms (General Health) Complaints and Symptoms: Negative for: Fatigue; Fever; Chills; Marked Weight Change Respiratory Complaints and Symptoms: Negative for: Chronic or frequent coughs; Shortness of Breath Medical History: Negative for: Aspiration; Asthma; Chronic Obstructive Pulmonary Disease (COPD); Pneumothorax; Sleep Apnea; Tuberculosis Cardiovascular Complaints and Symptoms: Positive for: LE edema Negative for: Chest pain Medical History: Positive for: Hypertension Negative for: Angina; Arrhythmia; Congestive Heart Failure; Coronary Artery Disease; Deep Vein Thrombosis; Hypotension; Myocardial Infarction; Peripheral Arterial Disease; Peripheral Venous Disease;  Phlebitis; Vasculitis Psychiatric Complaints and Symptoms: Negative for: Anxiety; Claustrophobia Medical History: Negative for: Anorexia/bulimia; Confinement Anxiety Eyes Medical History: Negative for: Cataracts; Glaucoma; Optic Neuritis Ear/Nose/Mouth/Throat Medical History: Negative for: Chronic sinus problems/congestion; Middle ear problems Hematologic/Lymphatic Medical History: Negative for: Anemia; Hemophilia; Human Immunodeficiency Virus; Lymphedema; Sickle Cell Disease ANUPAMA, PIEHL ([id number]) Gastrointestinal Medical History: Negative for: Cirrhosis ; Colitis; Crohnos; Hepatitis A; Hepatitis B; Hepatitis C Endocrine Medical History: Positive for: Type II Diabetes - past, not anymore Genitourinary Medical History: Negative for: End Stage Renal Disease Immunological Medical History: Negative for: Lupus Erythematosus; Raynaudos; Scleroderma Integumentary (Skin) Medical History: Negative for: History of Burn; History of pressure wounds Musculoskeletal Medical History: Negative for: Gout; Rheumatoid Arthritis; Osteoarthritis; Osteomyelitis Neurologic Medical History: Negative for: Dementia; Neuropathy; Quadriplegia; Paraplegia; Seizure Disorder Oncologic Medical History: Negative for: Received Chemotherapy; Received Radiation Immunizations Pneumococcal Vaccine: Received Pneumococcal Vaccination: No Implantable Devices None Family and Social History Cancer: No; Diabetes: No; Heart Disease: Yes - Father; Hereditary Spherocytosis: No; Hypertension: No; Kidney Disease: Yes - Father; Lung Disease: No; Seizures: No; Stroke: No; Thyroid Problems: No; Tuberculosis: No; Former smoker - 20 years - ended on 08/30/2008; Marital Status - Married; Alcohol Use: Never; Drug Use: No History; Caffeine Use: Daily - tea; Financial Concerns: No; Food, Clothing or Shelter Needs: No; Support System Lacking: No; Transportation Concerns: No Physician Affirmation I have reviewed and  agree with the above information. Electronic Signature(s) KEVIONNA, HEFFLER ([id number]) Signed: 06/13/2019 4:44:26 PM By: Curtis Sites Signed: 06/13/2019 6:12:50 PM By: Lenda Kelp PA-C Entered By: Lenda Kelp on 06/13/2019 12:53:55 YOANA, STAIB ([id number]) -------------------------------------------------------------------------------- SuperBill Details Patient Name: Laurie Dickson Date of Service: 06/12/2019 Medical Record Number: [id number] Patient Account Number: [id number] Date of Birth/Sex: [birth year] (68 y.o. F) Treating RN: Curtis Sites Primary Care Provider: Einar Crow Other Clinician: Referring Provider: Einar Crow Treating Provider/Extender: Linwood Dibbles, [place] Marchiano Weeks in Treatment: 2 Diagnosis Coding ICD-10 Codes Code Description I89.0 Lymphedema, not elsewhere classified L97.822 Non-pressure chronic ulcer of other part of left lower leg with fat layer exposed L97.812 Non-pressure chronic ulcer of other part of right lower leg with fat layer exposed E11.622 Type 2 diabetes mellitus with other skin ulcer I10 Essential (  primary) hypertension Facility Procedures CPT4 Code: [id number] Description: [phone] - WOUND CARE VISIT-LEV 2 EST PT Modifier: Quantity: 1 Physician Procedures CPT4 Code Description: [id number] - WC PHYS LEVEL 3 - EST PT ICD-10 Diagnosis Description I89.0 Lymphedema, not elsewhere classified L97.822 Non-pressure chronic ulcer of other part of left lower leg wit L97.812 Non-pressure chronic ulcer of other  part of right lower leg wi E11.622 Type 2 diabetes mellitus with other skin ulcer Modifier: h fat layer expos th fat layer expo Quantity: 1 ed sed Electronic Signature(s) Signed: 06/13/2019 6:12:50 PM By: Worthy Keeler PA-C Entered By: Worthy Keeler on 06/13/2019 12:56:23

## 2019-06-18 NOTE — Progress Notes (Signed)
JEYMI, HEPP ([id number]) Visit Report for 06/08/2019 Arrival Information Details Patient Name: Laurie Dickson, Laurie Dickson Date of Service: 06/08/2019 2:00 PM Medical Record Number: [id number] Patient Account Number: [id number] Date of Birth/Sex: April 25, 1951 (68 y.o. F) Treating RN: Army Melia Primary Care Rylyn Zawistowski: Frazier Richards Other Clinician: Referring Lemoyne Scarpati: Frazier Richards Treating Shyasia Funches/Extender: Melburn Hake, HOYT Weeks in Treatment: 2 Visit Information History Since Last Visit Added or deleted any medications: No Patient Arrived: Ambulatory Any new allergies or adverse reactions: No Arrival Time: 13:58 Had a fall or experienced change in No Accompanied By: self activities of daily living that may affect Transfer Assistance: None risk of falls: Patient Identification Verified: Yes Signs or symptoms of abuse/neglect since last visito No Hospitalized since last visit: No Has Dressing in Place as Prescribed: Yes Pain Present Now: No Electronic Signature(Dickson) Signed: 06/18/2019 8:11:06 AM By: Army Melia Entered By: Army Melia on 06/08/2019 13:58:45 Laurie Dickson ([id number]) -------------------------------------------------------------------------------- Clinic Level of Care Assessment Details Patient Name: Laurie Dickson Date of Service: 06/08/2019 2:00 PM Medical Record Number: [id number] Patient Account Number: [id number] Date of Birth/Sex: 1950/09/30 (68 y.o. F) Treating RN: Montey Hora Primary Care Benjamim Harnish: Frazier Richards Other Clinician: Referring Almeda Ezra: Frazier Richards Treating Alyssa Rotondo/Extender: Melburn Hake, HOYT Weeks in Treatment: 2 Clinic Level of Care Assessment Items TOOL 4 Quantity Score []  - Use when only an EandM is performed on FOLLOW-UP visit 0 ASSESSMENTS - Nursing Assessment / Reassessment X - Reassessment of Co-morbidities (includes updates in patient status) 1 10 X- 1 5 Reassessment of Adherence to Treatment Plan ASSESSMENTS  - Wound and Skin Assessment / Reassessment []  - Simple Wound Assessment / Reassessment - one wound 0 X- 1 5 Complex Wound Assessment / Reassessment - multiple wounds []  - 0 Dermatologic / Skin Assessment (not related to wound area) ASSESSMENTS - Focused Assessment X - Circumferential Edema Measurements - multi extremities 1 5 []  - 0 Nutritional Assessment / Counseling / Intervention X- 1 5 Lower Extremity Assessment (monofilament, tuning fork, pulses) []  - 0 Peripheral Arterial Disease Assessment (using hand held doppler) ASSESSMENTS - Ostomy and/or Continence Assessment and Care []  - Incontinence Assessment and Management 0 []  - 0 Ostomy Care Assessment and Management (repouching, etc.) PROCESS - Coordination of Care X - Simple Patient / Family Education for ongoing care 1 15 []  - 0 Complex (extensive) Patient / Family Education for ongoing care X- 1 10 Staff obtains Programmer, systems, Records, Test Results / Process Orders []  - 0 Staff telephones HHA, Nursing Homes / Clarify orders / etc []  - 0 Routine Transfer to another Facility (non-emergent condition) []  - 0 Routine Hospital Admission (non-emergent condition) []  - 0 New Admissions / Biomedical engineer / Ordering NPWT, Apligraf, etc. []  - 0 Emergency Hospital Admission (emergent condition) X- 1 10 Simple Discharge Coordination RAMATOULAYE, PACK. ([id number]) []  - 0 Complex (extensive) Discharge Coordination PROCESS - Special Needs []  - Pediatric / Minor Patient Management 0 []  - 0 Isolation Patient Management []  - 0 Hearing / Language / Visual special needs []  - 0 Assessment of Community assistance (transportation, D/C planning, etc.) []  - 0 Additional assistance / Altered mentation []  - 0 Support Surface(Dickson) Assessment (bed, cushion, seat, etc.) INTERVENTIONS - Wound Cleansing / Measurement []  - Simple Wound Cleansing - one wound 0 X- 2 5 Complex Wound Cleansing - multiple wounds X- 1 5 Wound Imaging  (photographs - any number of wounds) []  - 0 Wound Tracing (instead of photographs) []  - 0 Simple Wound Measurement - one wound X- 2  5 Complex Wound Measurement - multiple wounds INTERVENTIONS - Wound Dressings  - Small Wound Dressing one or multiple wounds 0  - 0 Medium Wound Dressing one or multiple wounds  - 0 Large Wound Dressing one or multiple wounds  - 0 Application of Medications - topical  - 0 Application of Medications - injection INTERVENTIONS - Miscellaneous  - External ear exam 0  - 0 Specimen Collection (cultures, biopsies, blood, body fluids, etc.)  - 0 Specimen(Dickson) / Culture(Dickson) sent or taken to Lab for analysis  - 0 Patient Transfer (multiple staff / Nurse, adult / Similar devices)  - 0 Simple Staple / Suture removal (25 or less)  - 0 Complex Staple / Suture removal (26 or more)  - 0 Hypo / Hyperglycemic Management (close monitor of Blood Glucose)  - 0 Ankle / Brachial Index (ABI) - do not check if billed separately X- 1 5 Vital Signs Laurie Dickson, Laurie Dickson. ([id number]) Has the patient been seen at the hospital within the last three years: Yes Total Score: 95 Level Of Care: New/Established - Level 3 Electronic Signature(Dickson) Signed: 06/08/2019 4:36:34 PM By: Curtis Sites Entered By: Curtis Sites on 06/08/2019 14:51:19 Laurie Dickson ([id number]) -------------------------------------------------------------------------------- Encounter Discharge Information Details Patient Name: Laurie Dickson Date of Service: 06/08/2019 2:00 PM Medical Record Number: [id number] Patient Account Number: [id number] Date of Birth/Sex: December 28, 1950 (68 y.o. F) Treating RN: Curtis Sites Primary Care Stone Spirito: Einar Crow Other Clinician: Referring Anjanette Gilkey: Einar Crow Treating Jakson Delpilar/Extender: Linwood Dibbles, HOYT Weeks in Treatment: 2 Encounter Discharge Information Items Discharge Condition: Stable Ambulatory Status:  Ambulatory Discharge Destination: Home Transportation: Private Auto Accompanied By: self Schedule Follow-up Appointment: Yes Clinical Summary of Care: Electronic Signature(Dickson) Signed: 06/08/2019 4:36:34 PM By: Curtis Sites Entered By: Curtis Sites on 06/08/2019 15:43:44 Laurie Dickson ([id number]) -------------------------------------------------------------------------------- Lower Extremity Assessment Details Patient Name: Laurie Dickson Date of Service: 06/08/2019 2:00 PM Medical Record Number: [id number] Patient Account Number: [id number] Date of Birth/Sex: 03-Sep-1950 (68 y.o. F) Treating RN: Rodell Perna Primary Care Billee Balcerzak: Einar Crow Other Clinician: Referring Palmer Fahrner: Einar Crow Treating Anari Evitt/Extender: Linwood Dibbles, HOYT Weeks in Treatment: 2 Edema Assessment Assessed: [Left: No] [Right: No] Edema: [Left: No] [Right: No] Calf Left: Right: Point of Measurement: 32 cm From Medial Instep 62 cm 61 cm Ankle Left: Right: Point of Measurement: 9 cm From Medial Instep 40 cm 37 cm Vascular Assessment Pulses: Dorsalis Pedis Palpable: [Left:Yes] [Right:Yes] Electronic Signature(Dickson) Signed: 06/18/2019 8:11:06 AM By: Rodell Perna Entered By: Rodell Perna on 06/08/2019 14:01:22 Laurie Dickson ([id number]) -------------------------------------------------------------------------------- Multi Wound Chart Details Patient Name: Laurie Dickson Date of Service: 06/08/2019 2:00 PM Medical Record Number: [id number] Patient Account Number: [id number] Date of Birth/Sex: Mar 07, 1951 (68 y.o. F) Treating RN: Curtis Sites Primary Care Lorice Lafave: Einar Crow Other Clinician: Referring Jarrid Lienhard: Einar Crow Treating Kieon Lawhorn/Extender: Linwood Dibbles, HOYT Weeks in Treatment: 2 Vital Signs Height(in): 64 Pulse(bpm): 69 Weight(lbs): 280 Blood Pressure(mmHg): 159/55 Body Mass Index(BMI): 48 Temperature(F): 98.7 Respiratory  Rate 16 (breaths/min): Photos: [N/A:N/A] Wound Location: Left, Anterior Lower Leg Right, Anterior Lower Leg N/A Wounding Event: Gradually Appeared Gradually Appeared N/A Primary Etiology: Lymphedema Lymphedema N/A Comorbid History: Hypertension, Type II Diabetes Hypertension, Type II Diabetes N/A Date Acquired: 05/04/2019 05/04/2019 N/A Weeks of Treatment: 2 2 N/A Wound Status: Healed - Epithelialized Healed - Epithelialized N/A Measurements L x W x D 0x0x0 0x0x0 N/A (cm) Area (cm) : 0 0 N/A Volume (cm) : 0 0 N/A % Reduction in Area: 100.00% 100.00% N/A % Reduction in Volume: 100.00%  100.00% N/A Classification: Partial Thickness Partial Thickness N/A Exudate Amount: None Present None Present N/A Wound Margin: Flat and Intact Flat and Intact N/A Granulation Amount: None Present (0%) None Present (0%) N/A Necrotic Amount: None Present (0%) None Present (0%) N/A Exposed Structures: Fascia: No Fascia: No N/A Fat Layer (Subcutaneous Fat Layer (Subcutaneous Tissue) Exposed: No Tissue) Exposed: No Tendon: No Tendon: No Muscle: No Muscle: No Joint: No Joint: No Bone: No Bone: No Limited to Skin Breakdown Limited to Skin Breakdown Epithelialization: Large (67-100%) Large (67-100%) N/A Treatment Notes Laurie Dickson, Laurie Dickson. ([id number]) Electronic Signature(Dickson) Signed: 06/08/2019 4:36:34 PM By: Curtis Sitesorthy, Joanna Entered By: Curtis Sitesorthy, Joanna on 06/08/2019 14:36:21 Laurie Dickson, Laurie Dickson. ([id number]) -------------------------------------------------------------------------------- Multi-Disciplinary Care Plan Details Patient Name: Laurie Dickson, Laurie Dickson. Date of Service: 06/08/2019 2:00 PM Medical Record Number: [id number] Patient Account Number: [id number] Date of Birth/Sex: 02/28/1951 (68 y.o. F) Treating RN: Curtis Sitesorthy, Joanna Primary Care Ryane Konieczny: Einar CrowAnderson, Marshall Other Clinician: Referring Kelcy Baeten: Einar CrowAnderson, Marshall Treating Rourke Mcquitty/Extender: Linwood DibblesSTONE III, HOYT Weeks in Treatment: 2 Active  Inactive Electronic Signature(Dickson) Signed: 06/08/2019 4:36:34 PM By: Curtis Sitesorthy, Joanna Entered By: Curtis Sitesorthy, Joanna on 06/08/2019 14:36:13 Laurie Dickson, Laurie Dickson. ([id number]) -------------------------------------------------------------------------------- Pain Assessment Details Patient Name: Laurie Dickson, Laurie Dickson. Date of Service: 06/08/2019 2:00 PM Medical Record Number: [id number] Patient Account Number: [id number] Date of Birth/Sex: 07/21/1951 (68 y.o. F) Treating RN: Rodell PernaScott, Dajea Primary Care Brallan Denio: Einar CrowAnderson, Marshall Other Clinician: Referring Tesia Lybrand: Einar CrowAnderson, Marshall Treating Marleni Gallardo/Extender: Linwood DibblesSTONE III, HOYT Weeks in Treatment: 2 Active Problems Location of Pain Severity and Description of Pain Patient Has Paino No Site Locations Pain Management and Medication Current Pain Management: Electronic Signature(Dickson) Signed: 06/18/2019 8:11:06 AM By: Rodell PernaScott, Dajea Entered By: Rodell PernaScott, Dajea on 06/08/2019 13:58:52 Laurie Dickson, Laurie Dickson. ([id number]) -------------------------------------------------------------------------------- Patient/Caregiver Education Details Patient Name: Laurie Dickson, Monet Dickson. Date of Service: 06/08/2019 2:00 PM Medical Record Number: [id number] Patient Account Number: [id number] Date of Birth/Gender: 01/13/1951 (68 y.o. F) Treating RN: Curtis Sitesorthy, Joanna Primary Care Physician: Einar CrowAnderson, Marshall Other Clinician: Referring Physician: Einar CrowAnderson, Marshall Treating Physician/Extender: Skeet SimmerSTONE III, HOYT Weeks in Treatment: 2 Education Assessment Education Provided To: Patient Education Topics Provided Venous: Handouts: Other: continue compression and pumps Electronic Signature(Dickson) Signed: 06/08/2019 4:36:34 PM By: Curtis Sitesorthy, Joanna Entered By: Curtis Sitesorthy, Joanna on 06/08/2019 15:43:31 Laurie Dickson, Mikia Dickson. ([id number]) -------------------------------------------------------------------------------- Wound Assessment Details Patient Name: Laurie Dickson, Gaylyn Dickson. Date of Service: 06/08/2019 2:00 PM Medical  Record Number: [id number] Patient Account Number: [id number] Date of Birth/Sex: 09/20/1950 (68 y.o. F) Treating RN: Curtis Sitesorthy, Joanna Primary Care Augusto Deckman: Einar CrowAnderson, Marshall Other Clinician: Referring Caragh Gasper: Einar CrowAnderson, Marshall Treating Joelle Flessner/Extender: Linwood DibblesSTONE III, HOYT Weeks in Treatment: 2 Wound Status Wound Number: 1 Primary Etiology: Lymphedema Wound Location: Left, Anterior Lower Leg Wound Status: Healed - Epithelialized Wounding Event: Gradually Appeared Comorbid History: Hypertension, Type II Diabetes Date Acquired: 05/04/2019 Weeks Of Treatment: 2 Clustered Wound: No Photos Wound Measurements Length: (cm) 0 % Reductio Width: (cm) 0 % Reductio Depth: (cm) 0 Epithelial Area: (cm) 0 Tunneling Volume: (cm) 0 Undermini n in Area: 100% n in Volume: 100% ization: Large (67-100%) : No ng: No Wound Description Classification: Partial Thickness Foul Odor Wound Margin: Flat and Intact Slough/Fib Exudate Amount: None Present After Cleansing: No rino No Wound Bed Granulation Amount: None Present (0%) Exposed Structure Necrotic Amount: None Present (0%) Fascia Exposed: No Fat Layer (Subcutaneous Tissue) Exposed: No Tendon Exposed: No Muscle Exposed: No Joint Exposed: No Bone Exposed: No Limited to Skin Breakdown Electronic Signature(Dickson) Signed: 06/08/2019 4:36:34 PM By: Marcelo Baldyorthy, Joanna Cohrs, Danyetta Dickson. ([id number]) Entered By: Curtis Sitesorthy, Joanna on 06/08/2019 14:34:49 Galati, Latanza Dickson. (  [id number]) -------------------------------------------------------------------------------- Wound Assessment Details Patient Name: EFFIE, WAHLERT Date of Service: 06/08/2019 2:00 PM Medical Record Number: [id number] Patient Account Number: [id number] Date of Birth/Sex: 24-Jun-1951 (68 y.o. F) Treating RN: Curtis Sites Primary Care Shi Blankenship: Einar Crow Other Clinician: Referring Avid Guillette: Einar Crow Treating Lyrica Mcclarty/Extender: Linwood Dibbles, HOYT Weeks in Treatment: 2 Wound  Status Wound Number: 2 Primary Etiology: Lymphedema Wound Location: Right, Anterior Lower Leg Wound Status: Healed - Epithelialized Wounding Event: Gradually Appeared Comorbid History: Hypertension, Type II Diabetes Date Acquired: 05/04/2019 Weeks Of Treatment: 2 Clustered Wound: No Photos Wound Measurements Length: (cm) 0 % Reductio Width: (cm) 0 % Reductio Depth: (cm) 0 Epithelial Area: (cm) 0 Tunneling Volume: (cm) 0 Undermini n in Area: 100% n in Volume: 100% ization: Large (67-100%) : No ng: No Wound Description Classification: Partial Thickness Foul Odor Wound Margin: Flat and Intact Slough/Fib Exudate Amount: None Present After Cleansing: No rino No Wound Bed Granulation Amount: None Present (0%) Exposed Structure Necrotic Amount: None Present (0%) Fascia Exposed: No Fat Layer (Subcutaneous Tissue) Exposed: No Tendon Exposed: No Muscle Exposed: No Joint Exposed: No Bone Exposed: No Limited to Skin Breakdown Electronic Signature(Dickson) Signed: 06/08/2019 4:36:34 PM By: Marcelo Baldy ([id number]) Entered By: Curtis Sites on 06/08/2019 14:34:50 Laurie Dickson ([id number]) -------------------------------------------------------------------------------- Vitals Details Patient Name: Laurie Dickson Date of Service: 06/08/2019 2:00 PM Medical Record Number: [id number] Patient Account Number: [id number] Date of Birth/Sex: 12-Oct-1950 (68 y.o. F) Treating RN: Rodell Perna Primary Care Dakwan Pridgen: Einar Crow Other Clinician: Referring Rameses Ou: Einar Crow Treating Dwight Adamczak/Extender: Linwood Dibbles, HOYT Weeks in Treatment: 2 Vital Signs Time Taken: 13:58 Temperature (F): 98.7 Height (in): 64 Pulse (bpm): 69 Weight (lbs): 280 Respiratory Rate (breaths/min): 16 Body Mass Index (BMI): 48.1 Blood Pressure (mmHg): 159/55 Reference Range: 80 - 120 mg / dl Electronic Signature(Dickson) Signed: 06/18/2019 8:11:06 AM By: Rodell Perna Entered  By: Rodell Perna on 06/08/2019 13:59:13

## 2019-06-25 ENCOUNTER — Other Ambulatory Visit: Payer: Self-pay | Admitting: Internal Medicine

## 2019-06-25 DIAGNOSIS — Z1231 Encounter for screening mammogram for malignant neoplasm of breast: Secondary | ICD-10-CM

## 2019-09-13 ENCOUNTER — Ambulatory Visit
Admission: RE | Admit: 2019-09-13 | Discharge: 2019-09-13 | Disposition: A | Discharge status: Home / Self Care | Payer: Managed Care, Other (non HMO) | Source: Ambulatory Visit | Attending: Internal Medicine | Admitting: Internal Medicine

## 2019-09-13 DIAGNOSIS — Z1231 Encounter for screening mammogram for malignant neoplasm of breast: Secondary | ICD-10-CM | POA: Diagnosis present

## 2020-02-16 ENCOUNTER — Encounter: Payer: Self-pay | Admitting: Emergency Medicine

## 2020-02-16 ENCOUNTER — Emergency Department: Payer: Managed Care, Other (non HMO)

## 2020-02-16 ENCOUNTER — Emergency Department
Admission: EM | Admit: 2020-02-16 | Discharge: 2020-02-16 | Disposition: A | Discharge status: Home / Self Care | Payer: Managed Care, Other (non HMO) | Attending: Emergency Medicine | Admitting: Emergency Medicine

## 2020-02-16 ENCOUNTER — Other Ambulatory Visit: Payer: Self-pay

## 2020-02-16 DIAGNOSIS — W010XXA Fall on same level from slipping, tripping and stumbling without subsequent striking against object, initial encounter: Secondary | ICD-10-CM | POA: Diagnosis not present

## 2020-02-16 DIAGNOSIS — Y939 Activity, unspecified: Secondary | ICD-10-CM | POA: Insufficient documentation

## 2020-02-16 DIAGNOSIS — N189 Chronic kidney disease, unspecified: Secondary | ICD-10-CM | POA: Insufficient documentation

## 2020-02-16 DIAGNOSIS — Z87891 Personal history of nicotine dependence: Secondary | ICD-10-CM | POA: Diagnosis not present

## 2020-02-16 DIAGNOSIS — E1122 Type 2 diabetes mellitus with diabetic chronic kidney disease: Secondary | ICD-10-CM | POA: Diagnosis not present

## 2020-02-16 DIAGNOSIS — Y929 Unspecified place or not applicable: Secondary | ICD-10-CM | POA: Insufficient documentation

## 2020-02-16 DIAGNOSIS — Y999 Unspecified external cause status: Secondary | ICD-10-CM | POA: Diagnosis not present

## 2020-02-16 DIAGNOSIS — S52501A Unspecified fracture of the lower end of right radius, initial encounter for closed fracture: Secondary | ICD-10-CM | POA: Diagnosis not present

## 2020-02-16 DIAGNOSIS — I129 Hypertensive chronic kidney disease with stage 1 through stage 4 chronic kidney disease, or unspecified chronic kidney disease: Secondary | ICD-10-CM | POA: Diagnosis not present

## 2020-02-16 DIAGNOSIS — S0181XA Laceration without foreign body of other part of head, initial encounter: Secondary | ICD-10-CM | POA: Insufficient documentation

## 2020-02-16 MED ORDER — ONDANSETRON 4 MG PO TBDP
4.0000 mg | ORAL_TABLET | Freq: Once | ORAL | Status: AC
  Administered 2020-02-16: 4 mg via ORAL
  Filled 2020-02-16: qty 1

## 2020-02-16 MED ORDER — LIDOCAINE-EPINEPHRINE 2 %-1:100000 IJ SOLN
20.0000 mL | Freq: Once | INTRAMUSCULAR | Status: AC
  Administered 2020-02-16: 20 mL

## 2020-02-16 MED ORDER — ONDANSETRON 4 MG PO TBDP: 4.0000 mg | ORAL_TABLET | Freq: Three times a day (TID) | ORAL | 0 refills | Status: AC | PRN

## 2020-02-16 MED ORDER — FENTANYL CITRATE (PF) 100 MCG/2ML IJ SOLN
50.0000 ug | Freq: Once | INTRAMUSCULAR | Status: AC
  Administered 2020-02-16: 50 ug via INTRAMUSCULAR
  Filled 2020-02-16: qty 2

## 2020-02-16 MED ORDER — OXYCODONE-ACETAMINOPHEN 7.5-325 MG PO TABS
1.0000 | ORAL_TABLET | Freq: Once | ORAL | Status: AC
  Administered 2020-02-16: 1 via ORAL
  Filled 2020-02-16: qty 1

## 2020-02-16 MED ORDER — OXYCODONE-ACETAMINOPHEN 5-325 MG PO TABS: 1.0000 | ORAL_TABLET | Freq: Four times a day (QID) | ORAL | 0 refills | Status: AC | PRN

## 2020-02-16 MED ORDER — CEPHALEXIN 500 MG PO CAPS: 500.0000 mg | ORAL_CAPSULE | Freq: Three times a day (TID) | ORAL | 0 refills | Status: AC

## 2020-02-16 NOTE — ED Notes (Signed)
Pt states she fell and hit her head on the door handle at a restaurant- no LOC- right arm is currently splinted from EMS

## 2020-02-16 NOTE — Discharge Instructions (Signed)
Take Keflex 3 times daily for the next 7 days. Have sutures removed in 5 days. Please follow-up with orthopedics as soon as possible. You can take oxycodone and Zofran for pain.

## 2020-02-16 NOTE — ED Triage Notes (Signed)
Pt presents to ED via [facility] with c/o mechanical fall. Per EMS pt fell face first and hit her head, per EMS pt denies LOC at this time. Pt with approx 3-4 in laceration to L forehead, bleeding controlled on arrival to ED. Pt also c/o R wrist pain, splint applied by EMS PTA.    205/98 70 99%

## 2020-02-16 NOTE — ED Triage Notes (Signed)
Pt c/o mechanical fall, tripped over a door, pt with laceration to forehead and wrist pain. Pt A&O x4, NAD noted at this time.

## 2020-02-16 NOTE — ED Provider Notes (Signed)
Emergency Department Provider Note  ____________________________________________  Time seen: Approximately 8:27 PM  I have reviewed the triage vital signs and the nursing notes.   HISTORY  Chief Complaint Fall and Laceration   Historian Patient     HPI Laurie Dickson is a 69 y.o. female presents to the emergency department after a mechanical fall in which patient tripped over a door. Patient sustained a large, 4 inch laceration that extended from right forehead to right temple. Patient denies loss of consciousness. She is complaining of acute right wrist pain. No numbness or tingling in the right upper extremity. No chest pain, chest tightness or abdominal pain. No other alleviating measures have been attempted.   Past Medical History:  Diagnosis Date  . Calculus of kidney 01/25/2013  . Chronic kidney disease    stones,hydronephrosis  . Diabetes ([place])   . Diabetes mellitus without complication ([place])    Resolved since bariatric surgery  . Difficult intubation   . Edema   . Edema extremities   . Hematuria, microscopic 01/12/2013  . Hydronephrosis   . Hypertension   . Kidney stone   . Obesity   . RLS (restless legs syndrome)   . Sleep apnea      Immunizations up to date:  Yes.     Past Medical History:  Diagnosis Date  . Calculus of kidney 01/25/2013  . Chronic kidney disease    stones,hydronephrosis  . Diabetes ([place])   . Diabetes mellitus without complication ([place])    Resolved since bariatric surgery  . Difficult intubation   . Edema   . Edema extremities   . Hematuria, microscopic 01/12/2013  . Hydronephrosis   . Hypertension   . Kidney stone   . Obesity   . RLS (restless legs syndrome)   . Sleep apnea     Patient Active Problem List   Diagnosis Date Noted  . Encounter for general adult medical examination without abnormal findings 08/04/2015  . Ureteral stone with hydronephrosis 02/06/2015  . Restless leg 12/12/2014  . Extreme obesity 06/09/2014   . Morbid obesity ([place]) 06/09/2014  . Diabetes mellitus, type 2 ([place]) 03/24/2014  . Accumulation of fluid in tissues 03/24/2014  . Benign hypertension 03/24/2014  . Type II diabetes mellitus with manifestations ([place]) 03/24/2014  . Adiposity 03/24/2014  . Type 2 diabetes mellitus with other specified complication ([place]) [id number]/26/7124  . Type 2 diabetes mellitus ([place]) 03/24/2014  . Edema 03/24/2014  . Calculus of kidney 01/25/2013  . Hematuria, microscopic 01/12/2013  . Microscopic hematuria 01/12/2013    Past Surgical History:  Procedure Laterality Date  . ABDOMINAL HYSTERECTOMY    . BACK SURGERY    . BARIATRIC SURGERY    . CYSTOSCOPY W/ RETROGRADES Left 01/20/2015   Procedure: CYSTOSCOPY WITH RETROGRADE PYELOGRAM;  Surgeon: Collier Flowers, MD;  Location: [facility];  Service: Urology;  Laterality: Left;  . CYSTOSCOPY WITH STENT PLACEMENT N/A 01/20/2015   Procedure: CYSTOSCOPY WITH STENT PLACEMENT;  Surgeon: Collier Flowers, MD;  Location: [facility];  Service: Urology;  Laterality: N/A;  . CYSTOSCOPY/URETEROSCOPY/HOLMIUM LASER/STENT PLACEMENT Left 09/10/2015   Procedure: CYSTOSCOPY/URETEROSCOPY//STENT PLACEMENT/retrograde pyelogram/stone basketing;  Surgeon: Hollice Espy, MD;  Location: [facility];  Service: Urology;  Laterality: Left;  . EXTRACORPOREAL SHOCK WAVE LITHOTRIPSY Left 04/24/2015   Procedure: EXTRACORPOREAL SHOCK WAVE LITHOTRIPSY (ESWL);  Surgeon: Collier Flowers, MD;  Location: [facility];  Service: Urology;  Laterality: Left;  . EXTRACORPOREAL SHOCK WAVE LITHOTRIPSY Right 03/13/2015   Procedure: EXTRACORPOREAL SHOCK WAVE LITHOTRIPSY (ESWL);  Surgeon: Vanna Scotland, MD;  Location: [facility];  Service: Urology;  Laterality: Right;  . EXTRACORPOREAL SHOCK WAVE LITHOTRIPSY Left 07/03/2015   Procedure: EXTRACORPOREAL SHOCK WAVE LITHOTRIPSY (ESWL);  Surgeon: Lorraine Lax, MD;  Location: [facility];  Service: Urology;  Laterality: Left;  . EXTRACORPOREAL SHOCK WAVE LITHOTRIPSY Left 08/21/2015    Procedure: EXTRACORPOREAL SHOCK WAVE LITHOTRIPSY (ESWL);  Surgeon: Vanna Scotland, MD;  Location: [facility];  Service: Urology;  Laterality: Left;  . OOPHORECTOMY    . salpingectomy    . URETEROSCOPY WITH HOLMIUM LASER LITHOTRIPSY Left 01/20/2015   Procedure: URETEROSCOPY WITH HOLMIUM LASER LITHOTRIPSY;  Surgeon: Lorraine Lax, MD;  Location: [facility];  Service: Urology;  Laterality: Left;    Prior to Admission medications   Medication Sig Start Date End Date Taking? Authorizing Provider  cephALEXin (KEFLEX) 500 MG capsule Take 1 capsule (500 mg total) by mouth 3 (three) times daily for 7 days. 02/16/20 02/23/20  Orvil Feil, PA-C  HYDROcodone-acetaminophen (NORCO) 5-325 MG tablet Take 1 tablet by mouth every 6 (six) hours as needed for up to 15 doses for severe pain. 11/27/17   Merrily Brittle, MD  ondansetron (ZOFRAN ODT) 4 MG disintegrating tablet Take 1 tablet (4 mg total) by mouth every 8 (eight) hours as needed for up to 5 days for nausea or vomiting. 02/16/20 02/21/20  Orvil Feil, PA-C  oxyCODONE-acetaminophen (PERCOCET/ROXICET) 5-325 MG tablet Take 1 tablet by mouth every 6 (six) hours as needed for up to 3 days. 02/16/20 02/19/20  Orvil Feil, PA-C  rOPINIRole (REQUIP) 0.5 MG tablet Take 1.5 mg by mouth at bedtime.     [provider]  tamsulosin (FLOMAX) 0.4 MG CAPS capsule Take 1 capsule (0.4 mg total) by mouth daily. 11/29/17   Michiel Cowboy A, PA-C    Allergies Sulfa antibiotics  Family History  Problem Relation Age of Onset  . Kidney cancer Father   . Bladder Cancer Neg Hx   . Breast cancer Neg Hx     Social History Social History   Tobacco Use  . Smoking status: Former Smoker    Quit date: 01/15/2011    Years since quitting: 9.0  . Smokeless tobacco: Never Used  Substance Use Topics  . Alcohol use: No    Alcohol/week: 0.0 standard drinks  . Drug use: No     Review of Systems  Constitutional: No fever/chills Eyes:  No discharge ENT: No upper  respiratory complaints. Respiratory: no cough. No SOB/ use of accessory muscles to breath Gastrointestinal:   No nausea, no vomiting.  No diarrhea.  No constipation. Musculoskeletal: Patient has right wrist pain.  Skin: Patient has facial laceration.     ____________________________________________   PHYSICAL EXAM:  VITAL SIGNS: ED Triage Vitals  Enc Vitals Group     BP 02/16/20 1727 (!) 209/85     Pulse Rate 02/16/20 1727 62     Resp 02/16/20 1727 18     Temp 02/16/20 1727 99.4 F (37.4 C)     Temp Source 02/16/20 1727 Oral     SpO2 02/16/20 1727 96 %     Weight 02/16/20 1737 250 lb (113.4 kg)     Height 02/16/20 1737 5\' 3"  (1.6 m)     Head Circumference --      Peak Flow --      Pain Score 02/16/20 1737 5     Pain Loc --      Pain Edu? --      Excl. in  GC? --      Constitutional: Alert and oriented. Well appearing and in no acute distress. Eyes: Conjunctivae are normal. PERRL. EOMI. Head: Patient has an 8 cm linear laceration from right forehead to right temple.  Laceration is deep to underlying muscle. ENT:      Nose: No congestion/rhinnorhea.      Mouth/Throat: Mucous membranes are moist.  Neck: No stridor. FROM.  Cardiovascular: Normal rate, regular rhythm. Normal S1 and S2.  Good peripheral circulation. Respiratory: Normal respiratory effort without tachypnea or retractions. Lungs CTAB. Good air entry to the bases with no decreased or absent breath sounds Gastrointestinal: Bowel sounds x 4 quadrants. Soft and nontender to palpation. No guarding or rigidity. No distention. Musculoskeletal: Full range of motion to all extremities. No obvious deformities noted.  Patient performs limited range of motion at the right wrist.  She is able to move all 5 right fingers.  Palpable radial pulse, right. Neurologic:  Normal for age. No gross focal neurologic deficits are appreciated.  Skin:  Skin is warm, dry and intact. No rash noted. Psychiatric: Mood and affect are normal  for age. Speech and behavior are normal.   ____________________________________________   LABS (all labs ordered are listed, but only abnormal results are displayed)  Labs Reviewed - No data to display ____________________________________________  EKG   ____________________________________________  RADIOLOGY Geraldo Pitter, personally viewed and evaluated these images (plain radiographs) as part of my medical decision making, as well as reviewing the written report by the radiologist.  DG Wrist Complete Right  Result Date: 02/16/2020 CLINICAL DATA:  69 year old female with fall and trauma to the right wrist. EXAM: RIGHT WRIST - COMPLETE 3+ VIEW COMPARISON:  None. FINDINGS: There is a transverse fracture of the distal radial metaphysis with dorsal angulation and displacement of the distal fracture fragment. Evaluation for possible intra-articular extension of the fracture is very limited on this radiograph. There is a mildly displaced fracture of the ulnar styloid. No dislocation. There is mild soft tissue swelling of the wrist. IMPRESSION: 1. Displaced and angulated fracture of the distal radial metaphysis. 2. Mildly displaced ulnar styloid fracture. Electronically Signed   By: Elgie Collard M.D.   On: 02/16/2020 18:29   CT Head Wo Contrast  Result Date: 02/16/2020 CLINICAL DATA:  Status post fall. EXAM: CT HEAD WITHOUT CONTRAST TECHNIQUE: Contiguous axial images were obtained from the base of the skull through the vertex without intravenous contrast. COMPARISON:  February 03, 2008 FINDINGS: Brain: There is mild cerebral atrophy with widening of the extra-axial spaces and ventricular dilatation. There are areas of decreased attenuation within the white matter tracts of the supratentorial brain, consistent with microvascular disease changes. A chronic left basal gangliar lacunar infarct is seen. This represents a new finding when compared to the prior study. Vascular: No hyperdense vessel or  unexpected calcification. Skull: Normal. Negative for fracture or focal lesion. Sinuses/Orbits: No acute finding. Other: None. IMPRESSION: 1. Generalized cerebral atrophy. 2. Chronic left basal gangliar lacunar infarct. 3. No acute intracranial abnormality. Electronically Signed   By: Aram Candela M.D.   On: 02/16/2020 18:39   CT Cervical Spine Wo Contrast  Result Date: 02/16/2020 CLINICAL DATA:  Status post fall. EXAM: CT CERVICAL SPINE WITHOUT CONTRAST TECHNIQUE: Multidetector CT imaging of the cervical spine was performed without intravenous contrast. Multiplanar CT image reconstructions were also generated. COMPARISON:  None. FINDINGS: Alignment: Normal. Skull base and vertebrae: No acute fracture. No primary bone lesion or focal pathologic process. Soft tissues and  spinal canal: No prevertebral fluid or swelling. No visible canal hematoma. Disc levels: Mild to moderate severity endplate sclerosis is seen at the level of C5-C6. Mild to moderate severity intervertebral disc space narrowing is also seen at this level. Mild to moderate severity bilateral facet joint hypertrophy is noted. Upper chest: Negative. Other: None. IMPRESSION: 1. No acute osseous abnormality. 2. Mild to moderate severity degenerative changes at the level of C5-C6. Electronically Signed   By: Aram Candela M.D.   On: 02/16/2020 18:43    ____________________________________________    PROCEDURES  Procedure(s) performed:     Marland KitchenMarland KitchenLaceration Repair  Date/Time: 02/16/2020 10:36 PM Performed by: Racheal Patches, PA-C Authorized by: Orvil Feil, PA-C   Consent:    Consent obtained:  Verbal   Consent given by:  Patient   Risks discussed:  Infection and pain Anesthesia (see MAR for exact dosages):    Anesthesia method:  Local infiltration   Local anesthetic:  Lidocaine 1% WITH epi Laceration details:    Location:  Face   Face location:  Forehead   Length (cm):  8   Depth (mm):  5 Repair type:     Repair type:  Intermediate Pre-procedure details:    Preparation:  Patient was prepped and draped in usual sterile fashion Exploration:    Contaminated: no   Treatment:    Area cleansed with:  Betadine   Amount of cleaning:  Standard   Irrigation solution:  Sterile saline   Visualized foreign bodies/material removed: no   Skin repair:    Repair method:  Sutures   Suture size:  5-0   Suture technique:  Simple interrupted   Number of sutures:  15 Approximation:    Approximation:  Close Post-procedure details:    Dressing:  Open (no dressing)   Patient tolerance of procedure:  Tolerated well, no immediate complications       Medications  fentaNYL (SUBLIMAZE) injection 50 mcg (50 mcg Intramuscular Given 02/16/20 1929)  lidocaine-EPINEPHrine (XYLOCAINE W/EPI) 2 %-1:100000 (with pres) injection 20 mL (20 mLs Infiltration Given by Other 02/16/20 1947)  oxyCODONE-acetaminophen (PERCOCET) 7.5-325 MG per tablet 1 tablet (1 tablet Oral Given 02/16/20 2127)  ondansetron (ZOFRAN-ODT) disintegrating tablet 4 mg (4 mg Oral Given 02/16/20 2126)     ____________________________________________   INITIAL IMPRESSION / ASSESSMENT AND PLAN / ED COURSE  Pertinent labs & imaging results that were available during my care of the patient were reviewed by me and considered in my medical decision making (see chart for details).      Assessment and plan Fall 69 year old female presents to the emergency department after a mechanical, nonsyncopal fall.  Patient was hypertensive at triage.  Vital signs were otherwise reassuring.  Patient had an 8 cm facial laceration that was repaired in the emergency department by Gala Romney PA-C and his PA student [place] PA-S.   CT head and CT cervical spine revealed no evidence of intracranial bleed or skull fracture.  No C-spine fracture on CT.  X-ray of the right wrist reveal an angulated distal radius fracture.  Patient was advised to have sutures  removed by primary care in 5 days.  She was placed in a volar splint and advised to follow-up with orthopedics.  She was oxycodone in the emergency department for pain.  She was discharged with a short course of Percocet.  Return precautions were given to return with new or worsening symptoms.  All patient questions were answered.   ____________________________________________  FINAL CLINICAL IMPRESSION(S) / ED  DIAGNOSES  Final diagnoses:  Facial laceration, initial encounter  Closed fracture of distal end of right radius, unspecified fracture morphology, initial encounter      NEW MEDICATIONS STARTED DURING THIS VISIT:  ED Discharge Orders         Ordered    oxyCODONE-acetaminophen (PERCOCET/ROXICET) 5-325 MG tablet  Every 6 hours PRN     Discontinue  Reprint     02/16/20 2133    ondansetron (ZOFRAN ODT) 4 MG disintegrating tablet  Every 8 hours PRN     Discontinue  Reprint     02/16/20 2133    cephALEXin (KEFLEX) 500 MG capsule  3 times daily     Discontinue  Reprint     02/16/20 2134              This chart was dictated using voice recognition software/Dragon. Despite best efforts to proofread, errors can occur which can change the meaning. Any change was purely unintentional.     Orvil FeilWoods, Jovante Hammitt M, PA-C 02/16/20 2243    Sharyn CreamerQuale, Mark, MD 02/17/20 [phone]

## 2020-10-06 DIAGNOSIS — E785 Hyperlipidemia, unspecified: Secondary | ICD-10-CM | POA: Diagnosis not present

## 2020-10-06 DIAGNOSIS — E1169 Type 2 diabetes mellitus with other specified complication: Secondary | ICD-10-CM | POA: Diagnosis not present

## 2020-10-06 DIAGNOSIS — E118 Type 2 diabetes mellitus with unspecified complications: Secondary | ICD-10-CM | POA: Diagnosis not present

## 2020-10-06 DIAGNOSIS — I10 Essential (primary) hypertension: Secondary | ICD-10-CM | POA: Diagnosis not present

## 2020-10-09 DIAGNOSIS — H109 Unspecified conjunctivitis: Secondary | ICD-10-CM | POA: Diagnosis not present

## 2020-11-14 DIAGNOSIS — H43812 Vitreous degeneration, left eye: Secondary | ICD-10-CM | POA: Diagnosis not present

## 2020-11-14 DIAGNOSIS — H2513 Age-related nuclear cataract, bilateral: Secondary | ICD-10-CM | POA: Diagnosis not present

## 2021-01-27 DIAGNOSIS — I10 Essential (primary) hypertension: Secondary | ICD-10-CM | POA: Diagnosis not present

## 2021-01-27 DIAGNOSIS — E118 Type 2 diabetes mellitus with unspecified complications: Secondary | ICD-10-CM | POA: Diagnosis not present

## 2021-02-03 DIAGNOSIS — E1169 Type 2 diabetes mellitus with other specified complication: Secondary | ICD-10-CM | POA: Diagnosis not present

## 2021-02-03 DIAGNOSIS — Z0000 Encounter for general adult medical examination without abnormal findings: Secondary | ICD-10-CM | POA: Diagnosis not present

## 2021-02-03 DIAGNOSIS — E118 Type 2 diabetes mellitus with unspecified complications: Secondary | ICD-10-CM | POA: Diagnosis not present

## 2021-02-03 DIAGNOSIS — I10 Essential (primary) hypertension: Secondary | ICD-10-CM | POA: Diagnosis not present

## 2021-02-03 DIAGNOSIS — E785 Hyperlipidemia, unspecified: Secondary | ICD-10-CM | POA: Diagnosis not present

## 2021-03-18 DIAGNOSIS — R0600 Dyspnea, unspecified: Secondary | ICD-10-CM | POA: Diagnosis not present

## 2021-03-18 DIAGNOSIS — R0689 Other abnormalities of breathing: Secondary | ICD-10-CM | POA: Diagnosis not present

## 2021-05-18 ENCOUNTER — Ambulatory Visit: Payer: Managed Care, Other (non HMO)

## 2021-07-13 DIAGNOSIS — J019 Acute sinusitis, unspecified: Secondary | ICD-10-CM | POA: Diagnosis not present

## 2021-07-13 DIAGNOSIS — R059 Cough, unspecified: Secondary | ICD-10-CM | POA: Diagnosis not present

## 2021-07-30 DIAGNOSIS — Z79899 Other long term (current) drug therapy: Secondary | ICD-10-CM | POA: Diagnosis not present

## 2021-07-30 DIAGNOSIS — E7800 Pure hypercholesterolemia, unspecified: Secondary | ICD-10-CM | POA: Diagnosis not present

## 2021-07-30 DIAGNOSIS — I10 Essential (primary) hypertension: Secondary | ICD-10-CM | POA: Diagnosis not present

## 2021-07-30 DIAGNOSIS — E119 Type 2 diabetes mellitus without complications: Secondary | ICD-10-CM | POA: Diagnosis not present

## 2021-07-30 DIAGNOSIS — G2581 Restless legs syndrome: Secondary | ICD-10-CM | POA: Diagnosis not present

## 2021-07-30 DIAGNOSIS — M79662 Pain in left lower leg: Secondary | ICD-10-CM | POA: Diagnosis not present

## 2021-07-30 DIAGNOSIS — M79605 Pain in left leg: Secondary | ICD-10-CM | POA: Diagnosis not present

## 2021-07-30 DIAGNOSIS — M7989 Other specified soft tissue disorders: Secondary | ICD-10-CM | POA: Diagnosis not present

## 2021-07-30 DIAGNOSIS — I890 Lymphedema, not elsewhere classified: Secondary | ICD-10-CM | POA: Diagnosis not present

## 2021-08-05 DIAGNOSIS — E785 Hyperlipidemia, unspecified: Secondary | ICD-10-CM | POA: Diagnosis not present

## 2021-08-05 DIAGNOSIS — E1169 Type 2 diabetes mellitus with other specified complication: Secondary | ICD-10-CM | POA: Diagnosis not present

## 2021-08-05 DIAGNOSIS — I10 Essential (primary) hypertension: Secondary | ICD-10-CM | POA: Diagnosis not present

## 2021-08-05 DIAGNOSIS — E669 Obesity, unspecified: Secondary | ICD-10-CM | POA: Diagnosis not present

## 2021-09-23 DIAGNOSIS — Z79899 Other long term (current) drug therapy: Secondary | ICD-10-CM | POA: Diagnosis not present

## 2021-09-23 DIAGNOSIS — R109 Unspecified abdominal pain: Secondary | ICD-10-CM | POA: Diagnosis not present

## 2021-09-23 DIAGNOSIS — N200 Calculus of kidney: Secondary | ICD-10-CM | POA: Diagnosis not present

## 2021-09-23 DIAGNOSIS — E7800 Pure hypercholesterolemia, unspecified: Secondary | ICD-10-CM | POA: Diagnosis not present

## 2021-09-23 DIAGNOSIS — Z882 Allergy status to sulfonamides status: Secondary | ICD-10-CM | POA: Diagnosis not present

## 2021-09-23 DIAGNOSIS — Z87442 Personal history of urinary calculi: Secondary | ICD-10-CM | POA: Diagnosis not present

## 2021-09-23 DIAGNOSIS — I10 Essential (primary) hypertension: Secondary | ICD-10-CM | POA: Diagnosis not present

## 2021-09-23 DIAGNOSIS — E119 Type 2 diabetes mellitus without complications: Secondary | ICD-10-CM | POA: Diagnosis not present

## 2021-09-23 DIAGNOSIS — N132 Hydronephrosis with renal and ureteral calculous obstruction: Secondary | ICD-10-CM | POA: Diagnosis not present

## 2021-09-24 DIAGNOSIS — N132 Hydronephrosis with renal and ureteral calculous obstruction: Secondary | ICD-10-CM | POA: Diagnosis not present

## 2021-11-18 DIAGNOSIS — H43813 Vitreous degeneration, bilateral: Secondary | ICD-10-CM | POA: Diagnosis not present

## 2022-01-13 DIAGNOSIS — T07XXXA Unspecified multiple injuries, initial encounter: Secondary | ICD-10-CM | POA: Diagnosis not present

## 2022-01-13 DIAGNOSIS — L089 Local infection of the skin and subcutaneous tissue, unspecified: Secondary | ICD-10-CM | POA: Diagnosis not present

## 2022-01-13 DIAGNOSIS — W57XXXA Bitten or stung by nonvenomous insect and other nonvenomous arthropods, initial encounter: Secondary | ICD-10-CM | POA: Diagnosis not present

## 2022-01-29 DIAGNOSIS — I10 Essential (primary) hypertension: Secondary | ICD-10-CM | POA: Diagnosis not present

## 2022-01-29 DIAGNOSIS — E1169 Type 2 diabetes mellitus with other specified complication: Secondary | ICD-10-CM | POA: Diagnosis not present

## 2022-01-29 DIAGNOSIS — E785 Hyperlipidemia, unspecified: Secondary | ICD-10-CM | POA: Diagnosis not present

## 2022-02-04 DIAGNOSIS — E1169 Type 2 diabetes mellitus with other specified complication: Secondary | ICD-10-CM | POA: Diagnosis not present

## 2022-02-04 DIAGNOSIS — E669 Obesity, unspecified: Secondary | ICD-10-CM | POA: Diagnosis not present

## 2022-02-04 DIAGNOSIS — Z0000 Encounter for general adult medical examination without abnormal findings: Secondary | ICD-10-CM | POA: Diagnosis not present

## 2022-02-04 DIAGNOSIS — E785 Hyperlipidemia, unspecified: Secondary | ICD-10-CM | POA: Diagnosis not present

## 2022-02-04 DIAGNOSIS — R55 Syncope and collapse: Secondary | ICD-10-CM | POA: Diagnosis not present

## 2022-02-04 DIAGNOSIS — I10 Essential (primary) hypertension: Secondary | ICD-10-CM | POA: Diagnosis not present

## 2022-02-04 DIAGNOSIS — R7303 Prediabetes: Secondary | ICD-10-CM | POA: Diagnosis not present

## 2022-02-12 ENCOUNTER — Other Ambulatory Visit: Payer: Self-pay | Admitting: Internal Medicine

## 2022-02-12 DIAGNOSIS — Z1231 Encounter for screening mammogram for malignant neoplasm of breast: Secondary | ICD-10-CM

## 2022-02-18 DIAGNOSIS — Z79899 Other long term (current) drug therapy: Secondary | ICD-10-CM | POA: Diagnosis not present

## 2022-02-18 DIAGNOSIS — Z6834 Body mass index (BMI) 34.0-34.9, adult: Secondary | ICD-10-CM | POA: Diagnosis not present

## 2022-02-18 DIAGNOSIS — N179 Acute kidney failure, unspecified: Secondary | ICD-10-CM | POA: Diagnosis not present

## 2022-02-18 DIAGNOSIS — E669 Obesity, unspecified: Secondary | ICD-10-CM | POA: Diagnosis not present

## 2022-02-18 DIAGNOSIS — N135 Crossing vessel and stricture of ureter without hydronephrosis: Secondary | ICD-10-CM | POA: Diagnosis not present

## 2022-02-18 DIAGNOSIS — Z87891 Personal history of nicotine dependence: Secondary | ICD-10-CM | POA: Diagnosis not present

## 2022-02-18 DIAGNOSIS — E119 Type 2 diabetes mellitus without complications: Secondary | ICD-10-CM | POA: Diagnosis not present

## 2022-02-18 DIAGNOSIS — E785 Hyperlipidemia, unspecified: Secondary | ICD-10-CM | POA: Diagnosis not present

## 2022-02-18 DIAGNOSIS — Z9884 Bariatric surgery status: Secondary | ICD-10-CM | POA: Diagnosis not present

## 2022-02-18 DIAGNOSIS — Z882 Allergy status to sulfonamides status: Secondary | ICD-10-CM | POA: Diagnosis not present

## 2022-02-18 DIAGNOSIS — I10 Essential (primary) hypertension: Secondary | ICD-10-CM | POA: Diagnosis not present

## 2022-02-18 DIAGNOSIS — N132 Hydronephrosis with renal and ureteral calculous obstruction: Secondary | ICD-10-CM | POA: Diagnosis not present

## 2022-02-18 DIAGNOSIS — R1032 Left lower quadrant pain: Secondary | ICD-10-CM | POA: Diagnosis not present

## 2022-02-18 DIAGNOSIS — N201 Calculus of ureter: Secondary | ICD-10-CM | POA: Diagnosis not present

## 2022-02-19 DIAGNOSIS — N201 Calculus of ureter: Secondary | ICD-10-CM | POA: Diagnosis not present

## 2022-02-24 DIAGNOSIS — N200 Calculus of kidney: Secondary | ICD-10-CM | POA: Diagnosis not present

## 2022-02-24 DIAGNOSIS — R7303 Prediabetes: Secondary | ICD-10-CM | POA: Diagnosis not present

## 2022-02-24 DIAGNOSIS — I10 Essential (primary) hypertension: Secondary | ICD-10-CM | POA: Diagnosis not present

## 2022-04-12 DIAGNOSIS — N200 Calculus of kidney: Secondary | ICD-10-CM | POA: Diagnosis not present

## 2022-04-12 DIAGNOSIS — Z01818 Encounter for other preprocedural examination: Secondary | ICD-10-CM | POA: Diagnosis not present

## 2022-04-15 ENCOUNTER — Ambulatory Visit
Admission: RE | Admit: 2022-04-15 | Discharge: 2022-04-15 | Disposition: A | Discharge status: Home / Self Care | Payer: PPO | Source: Ambulatory Visit | Attending: Internal Medicine | Admitting: Internal Medicine

## 2022-04-15 DIAGNOSIS — Z1231 Encounter for screening mammogram for malignant neoplasm of breast: Secondary | ICD-10-CM | POA: Diagnosis not present

## 2022-04-21 DIAGNOSIS — L909 Atrophic disorder of skin, unspecified: Secondary | ICD-10-CM | POA: Diagnosis not present

## 2022-04-21 DIAGNOSIS — M216X1 Other acquired deformities of right foot: Secondary | ICD-10-CM | POA: Diagnosis not present

## 2022-04-21 DIAGNOSIS — I890 Lymphedema, not elsewhere classified: Secondary | ICD-10-CM | POA: Diagnosis not present

## 2022-04-21 DIAGNOSIS — M79671 Pain in right foot: Secondary | ICD-10-CM | POA: Diagnosis not present

## 2022-04-21 DIAGNOSIS — M7741 Metatarsalgia, right foot: Secondary | ICD-10-CM | POA: Diagnosis not present

## 2022-04-21 DIAGNOSIS — M216X2 Other acquired deformities of left foot: Secondary | ICD-10-CM | POA: Diagnosis not present

## 2022-05-19 DIAGNOSIS — N200 Calculus of kidney: Secondary | ICD-10-CM | POA: Diagnosis not present

## 2022-06-02 DIAGNOSIS — I10 Essential (primary) hypertension: Secondary | ICD-10-CM | POA: Diagnosis not present

## 2022-06-02 DIAGNOSIS — N201 Calculus of ureter: Secondary | ICD-10-CM | POA: Diagnosis not present

## 2022-06-02 DIAGNOSIS — Z87891 Personal history of nicotine dependence: Secondary | ICD-10-CM | POA: Diagnosis not present

## 2022-06-02 DIAGNOSIS — E119 Type 2 diabetes mellitus without complications: Secondary | ICD-10-CM | POA: Diagnosis not present

## 2022-06-02 DIAGNOSIS — E785 Hyperlipidemia, unspecified: Secondary | ICD-10-CM | POA: Diagnosis not present

## 2022-06-02 DIAGNOSIS — Z9884 Bariatric surgery status: Secondary | ICD-10-CM | POA: Diagnosis not present

## 2022-06-02 DIAGNOSIS — Z79899 Other long term (current) drug therapy: Secondary | ICD-10-CM | POA: Diagnosis not present

## 2022-06-02 DIAGNOSIS — N200 Calculus of kidney: Secondary | ICD-10-CM | POA: Diagnosis not present

## 2022-07-12 DIAGNOSIS — E7800 Pure hypercholesterolemia, unspecified: Secondary | ICD-10-CM | POA: Diagnosis not present

## 2022-07-12 DIAGNOSIS — Z882 Allergy status to sulfonamides status: Secondary | ICD-10-CM | POA: Diagnosis not present

## 2022-07-12 DIAGNOSIS — N200 Calculus of kidney: Secondary | ICD-10-CM | POA: Diagnosis not present

## 2022-07-12 DIAGNOSIS — E119 Type 2 diabetes mellitus without complications: Secondary | ICD-10-CM | POA: Diagnosis not present

## 2022-07-12 DIAGNOSIS — F17210 Nicotine dependence, cigarettes, uncomplicated: Secondary | ICD-10-CM | POA: Diagnosis not present

## 2022-07-12 DIAGNOSIS — I10 Essential (primary) hypertension: Secondary | ICD-10-CM | POA: Diagnosis not present

## 2022-09-01 DIAGNOSIS — N200 Calculus of kidney: Secondary | ICD-10-CM | POA: Diagnosis not present

## 2022-09-01 DIAGNOSIS — R7303 Prediabetes: Secondary | ICD-10-CM | POA: Diagnosis not present

## 2022-09-01 DIAGNOSIS — I10 Essential (primary) hypertension: Secondary | ICD-10-CM | POA: Diagnosis not present

## 2022-09-03 DIAGNOSIS — I10 Essential (primary) hypertension: Secondary | ICD-10-CM | POA: Diagnosis not present

## 2022-09-03 DIAGNOSIS — R7303 Prediabetes: Secondary | ICD-10-CM | POA: Diagnosis not present

## 2022-09-03 DIAGNOSIS — E7800 Pure hypercholesterolemia, unspecified: Secondary | ICD-10-CM | POA: Diagnosis not present

## 2022-09-03 DIAGNOSIS — Z23 Encounter for immunization: Secondary | ICD-10-CM | POA: Diagnosis not present

## 2022-09-29 DIAGNOSIS — M79672 Pain in left foot: Secondary | ICD-10-CM | POA: Diagnosis not present

## 2022-09-29 DIAGNOSIS — L909 Atrophic disorder of skin, unspecified: Secondary | ICD-10-CM | POA: Diagnosis not present

## 2022-09-29 DIAGNOSIS — M216X1 Other acquired deformities of right foot: Secondary | ICD-10-CM | POA: Diagnosis not present

## 2022-09-29 DIAGNOSIS — M7742 Metatarsalgia, left foot: Secondary | ICD-10-CM | POA: Diagnosis not present

## 2022-09-29 DIAGNOSIS — M216X2 Other acquired deformities of left foot: Secondary | ICD-10-CM | POA: Diagnosis not present

## 2022-09-29 DIAGNOSIS — M19072 Primary osteoarthritis, left ankle and foot: Secondary | ICD-10-CM | POA: Diagnosis not present

## 2022-09-29 DIAGNOSIS — M84375A Stress fracture, left foot, initial encounter for fracture: Secondary | ICD-10-CM | POA: Diagnosis not present

## 2022-09-29 DIAGNOSIS — I890 Lymphedema, not elsewhere classified: Secondary | ICD-10-CM | POA: Diagnosis not present

## 2022-10-20 DIAGNOSIS — L909 Atrophic disorder of skin, unspecified: Secondary | ICD-10-CM | POA: Diagnosis not present

## 2022-10-20 DIAGNOSIS — I890 Lymphedema, not elsewhere classified: Secondary | ICD-10-CM | POA: Diagnosis not present

## 2022-10-20 DIAGNOSIS — M216X1 Other acquired deformities of right foot: Secondary | ICD-10-CM | POA: Diagnosis not present

## 2022-10-20 DIAGNOSIS — M216X2 Other acquired deformities of left foot: Secondary | ICD-10-CM | POA: Diagnosis not present

## 2022-10-20 DIAGNOSIS — M79672 Pain in left foot: Secondary | ICD-10-CM | POA: Diagnosis not present

## 2022-10-20 DIAGNOSIS — M7742 Metatarsalgia, left foot: Secondary | ICD-10-CM | POA: Diagnosis not present

## 2022-10-20 DIAGNOSIS — M19072 Primary osteoarthritis, left ankle and foot: Secondary | ICD-10-CM | POA: Diagnosis not present

## 2022-11-09 ENCOUNTER — Encounter (INDEPENDENT_AMBULATORY_CARE_PROVIDER_SITE_OTHER): Payer: Self-pay | Admitting: Vascular Surgery

## 2022-11-09 ENCOUNTER — Ambulatory Visit (INDEPENDENT_AMBULATORY_CARE_PROVIDER_SITE_OTHER): Payer: HMO | Admitting: Vascular Surgery

## 2022-11-09 VITALS — BP 173/72 | HR 52 | Resp 16 | Wt 219.8 lb

## 2022-11-09 DIAGNOSIS — I890 Lymphedema, not elsewhere classified: Secondary | ICD-10-CM | POA: Diagnosis not present

## 2022-11-09 DIAGNOSIS — I10 Essential (primary) hypertension: Secondary | ICD-10-CM | POA: Diagnosis not present

## 2022-11-09 DIAGNOSIS — R609 Edema, unspecified: Secondary | ICD-10-CM

## 2022-11-09 DIAGNOSIS — E119 Type 2 diabetes mellitus without complications: Secondary | ICD-10-CM | POA: Diagnosis not present

## 2022-11-09 NOTE — Assessment & Plan Note (Signed)
blood pressure control important in reducing the progression of atherosclerotic disease. On appropriate oral medications.  

## 2022-11-09 NOTE — Assessment & Plan Note (Signed)
Resolved after weight loss

## 2022-11-09 NOTE — Progress Notes (Signed)
Patient ID: Laurie Dickson, female   DOB: 1951/07/07, 72 y.o.   MRN: [id number]  Chief Complaint  Patient presents with   New Patient (Initial Visit)    Ref Luana Shu consult lymphedema    HPI Laurie Dickson is a 72 y.o. female.  I am asked to see the patient by Dr. Luana Shu for evaluation of lower extremity lymphedema.  The patient has had chronic swelling and skin changes to the lower extremities for many years.  She previously weighed over 340 pounds and has lost a large amount of weight many years ago.  Her leg swelling never really went down with weight loss the rest of the body.  This is not particularly painful on the legs are heavy and to tire little easily.  She has worn compression socks in the past with no benefit.  She has a lymphedema pump that also does not help much.  She does try to elevate her legs and remain active.  She has never had cellulitis or ulceration to her knowledge.  No previous history of DVT or superficial thrombophlebitis to her knowledge.     Past Medical History:  Diagnosis Date   Calculus of kidney 01/25/2013   Chronic kidney disease    stones,hydronephrosis   Diabetes ([place])    Diabetes mellitus without complication ([place])    Resolved since bariatric surgery   Difficult intubation    Edema    Edema extremities    Hematuria, microscopic 01/12/2013   Hydronephrosis    Hypertension    Kidney stone    Obesity    RLS (restless legs syndrome)    Sleep apnea     Past Surgical History:  Procedure Laterality Date   ABDOMINAL HYSTERECTOMY     BACK SURGERY     BARIATRIC SURGERY     CYSTOSCOPY W/ RETROGRADES Left 01/20/2015   Procedure: CYSTOSCOPY WITH RETROGRADE PYELOGRAM;  Surgeon: Collier Flowers, MD;  Location: [facility];  Service: Urology;  Laterality: Left;   CYSTOSCOPY WITH STENT PLACEMENT N/A 01/20/2015   Procedure: CYSTOSCOPY WITH STENT PLACEMENT;  Surgeon: Collier Flowers, MD;  Location: [facility];  Service: Urology;  Laterality: N/A;    CYSTOSCOPY/URETEROSCOPY/HOLMIUM LASER/STENT PLACEMENT Left 09/10/2015   Procedure: CYSTOSCOPY/URETEROSCOPY//STENT PLACEMENT/retrograde pyelogram/stone basketing;  Surgeon: Hollice Espy, MD;  Location: [facility];  Service: Urology;  Laterality: Left;   EXTRACORPOREAL SHOCK WAVE LITHOTRIPSY Left 04/24/2015   Procedure: EXTRACORPOREAL SHOCK WAVE LITHOTRIPSY (ESWL);  Surgeon: Collier Flowers, MD;  Location: [facility];  Service: Urology;  Laterality: Left;   EXTRACORPOREAL SHOCK WAVE LITHOTRIPSY Right 03/13/2015   Procedure: EXTRACORPOREAL SHOCK WAVE LITHOTRIPSY (ESWL);  Surgeon: Hollice Espy, MD;  Location: [facility];  Service: Urology;  Laterality: Right;   EXTRACORPOREAL SHOCK WAVE LITHOTRIPSY Left 07/03/2015   Procedure: EXTRACORPOREAL SHOCK WAVE LITHOTRIPSY (ESWL);  Surgeon: Collier Flowers, MD;  Location: [facility];  Service: Urology;  Laterality: Left;   EXTRACORPOREAL SHOCK WAVE LITHOTRIPSY Left 08/21/2015   Procedure: EXTRACORPOREAL SHOCK WAVE LITHOTRIPSY (ESWL);  Surgeon: Hollice Espy, MD;  Location: [facility];  Service: Urology;  Laterality: Left;   OOPHORECTOMY     salpingectomy     URETEROSCOPY WITH HOLMIUM LASER LITHOTRIPSY Left 01/20/2015   Procedure: URETEROSCOPY WITH HOLMIUM LASER LITHOTRIPSY;  Surgeon: Collier Flowers, MD;  Location: [facility];  Service: Urology;  Laterality: Left;     Family History  Problem Relation Age of Onset   Kidney cancer Father    Bladder Cancer Neg Hx    Breast  cancer Neg Hx   No bleeding or clotting disorders    Social History   Tobacco Use   Smoking status: Former    Types: Cigarettes    Quit date: 01/15/2011    Years since quitting: 11.8   Smokeless tobacco: Never  Substance Use Topics   Alcohol use: No    Alcohol/week: 0.0 standard drinks of alcohol   Drug use: No     Allergies  Allergen Reactions   Sulfa Antibiotics Rash    Current Outpatient Medications  Medication Sig Dispense Refill   amLODipine (NORVASC) 5 MG tablet Take 5 mg by  mouth daily.     carvedilol (COREG) 6.25 MG tablet Take 6.25 mg by mouth 2 (two) times daily with a meal.     rOPINIRole (REQUIP) 0.5 MG tablet Take 1.5 mg by mouth at bedtime.      HYDROcodone-acetaminophen (NORCO) 5-325 MG tablet Take 1 tablet by mouth every 6 (six) hours as needed for up to 15 doses for severe pain. 15 tablet 0   tamsulosin (FLOMAX) 0.4 MG CAPS capsule Take 1 capsule (0.4 mg total) by mouth daily. 30 capsule 0   No current facility-administered medications for this visit.      REVIEW OF SYSTEMS (Negative unless checked)  Constitutional: '[]'$ Weight loss  '[]'$ Fever  '[]'$ Chills Cardiac: '[]'$ Chest pain   '[]'$ Chest pressure   '[]'$ Palpitations   '[]'$ Shortness of breath when laying flat   '[]'$ Shortness of breath at rest   '[]'$ Shortness of breath with exertion. Vascular:  '[]'$ Pain in legs with walking   '[]'$ Pain in legs at rest   '[]'$ Pain in legs when laying flat   '[]'$ Claudication   '[]'$ Pain in feet when walking  '[]'$ Pain in feet at rest  '[]'$ Pain in feet when laying flat   '[]'$ History of DVT   '[]'$ Phlebitis   '[x]'$ Swelling in legs   '[]'$ Varicose veins   '[]'$ Non-healing ulcers Pulmonary:   '[]'$ Uses home oxygen   '[]'$ Productive cough   '[]'$ Hemoptysis   '[]'$ Wheeze  '[]'$ COPD   '[]'$ Asthma Neurologic:  '[]'$ Dizziness  '[]'$ Blackouts   '[]'$ Seizures   '[]'$ History of stroke   '[]'$ History of TIA  '[]'$ Aphasia   '[]'$ Temporary blindness   '[]'$ Dysphagia   '[]'$ Weakness or numbness in arms   '[]'$ Weakness or numbness in legs Musculoskeletal:  '[x]'$ Arthritis   '[]'$ Joint swelling   '[]'$ Joint pain   '[]'$ Low back pain Hematologic:  '[]'$ Easy bruising  '[]'$ Easy bleeding   '[]'$ Hypercoagulable state   '[]'$ Anemic  '[]'$ Hepatitis Gastrointestinal:  '[]'$ Blood in stool   '[]'$ Vomiting blood  '[]'$ Gastroesophageal reflux/heartburn   '[]'$ Abdominal pain Genitourinary:  '[x]'$ Chronic kidney disease   '[]'$ Difficult urination  '[]'$ Frequent urination  '[]'$ Burning with urination   '[]'$ Hematuria Skin:  '[]'$ Rashes   '[]'$ Ulcers   '[]'$ Wounds Psychological:  '[]'$ History of anxiety   '[]'$  History of major depression.    Physical Exam BP (!)  173/72 (BP Location: Left Arm)   Pulse (!) 52   Resp 16   Wt 219 lb 12.8 oz (99.7 kg)   BMI 38.94 kg/m  Gen:  WD/WN, NAD Head: [place]/AT, No temporalis wasting.  Ear/Nose/Throat: Hearing grossly intact, nares w/o erythema or drainage, oropharynx w/o Erythema/Exudate Eyes: Conjunctiva clear, sclera non-icteric  Neck: trachea midline.  No JVD.  Pulmonary:  Good air movement, respirations not labored, no use of accessory muscles  Cardiac: bradycardic Vascular:  Vessel Right Left  Radial Palpable Palpable                          PT NP NP  DP  1+ 1+   Gastrointestinal:. No masses, surgical incisions, or scars. Musculoskeletal: M/S 5/5 throughout.  Extremities without ischemic changes.  No deformity or atrophy. 3+ BLE edema.  Significant skin thickening with hyperpigmentation right worse than left. Neurologic: Sensation grossly intact in extremities.  Symmetrical.  Speech is fluent. Motor exam as listed above. Psychiatric: Judgment intact, Mood & affect appropriate for pt's clinical situation. Dermatologic: No rashes or ulcers noted.  No cellulitis or open wounds.    Radiology No results found.  Labs No results found for this or any previous visit (from the past 2160 hour(s)).  Assessment/Plan:  Lymphedema The patient has fairly longstanding severe lymphedema which she has dealt with for over a decade.  We discussed typical treatment options including compression socks, elevation, and the lymphedema pump all of which she has used without tremendous success.  I have offered referral to the lymphedema clinic for manual lymphatic massage, but she does not think she would tolerate this well due to the tenderness in her legs.  No procedure or intervention is likely of benefit.  We discussed other compression garments that may be a possibility.  Patient does not have any ulceration or cellulitis and plans to just continue her current management unless her symptoms worsen.  She will contact  our office if she has worsening symptoms.  Type 2 diabetes mellitus ([place]) Resolved after weight loss  Benign hypertension blood pressure control important in reducing the progression of atherosclerotic disease. On appropriate oral medications.   Edema Longstanding as above      Leotis Pain 11/09/2022, 12:12 PM   This note was created with Dragon medical transcription system.  Any errors from dictation are unintentional.

## 2022-11-09 NOTE — Assessment & Plan Note (Signed)
The patient has fairly longstanding severe lymphedema which she has dealt with for over a decade.  We discussed typical treatment options including compression socks, elevation, and the lymphedema pump all of which she has used without tremendous success.  I have offered referral to the lymphedema clinic for manual lymphatic massage, but she does not think she would tolerate this well due to the tenderness in her legs.  No procedure or intervention is likely of benefit.  We discussed other compression garments that may be a possibility.  Patient does not have any ulceration or cellulitis and plans to just continue her current management unless her symptoms worsen.  She will contact our office if she has worsening symptoms.

## 2022-11-09 NOTE — Assessment & Plan Note (Signed)
Longstanding as above

## 2022-11-22 DIAGNOSIS — H43813 Vitreous degeneration, bilateral: Secondary | ICD-10-CM | POA: Diagnosis not present

## 2023-02-25 DIAGNOSIS — R7303 Prediabetes: Secondary | ICD-10-CM | POA: Diagnosis not present

## 2023-02-25 DIAGNOSIS — I10 Essential (primary) hypertension: Secondary | ICD-10-CM | POA: Diagnosis not present

## 2023-02-25 DIAGNOSIS — E7800 Pure hypercholesterolemia, unspecified: Secondary | ICD-10-CM | POA: Diagnosis not present

## 2023-03-02 DIAGNOSIS — R7303 Prediabetes: Secondary | ICD-10-CM | POA: Diagnosis not present

## 2023-03-02 DIAGNOSIS — Z0000 Encounter for general adult medical examination without abnormal findings: Secondary | ICD-10-CM | POA: Diagnosis not present

## 2023-03-02 DIAGNOSIS — I10 Essential (primary) hypertension: Secondary | ICD-10-CM | POA: Diagnosis not present

## 2023-03-02 DIAGNOSIS — E7800 Pure hypercholesterolemia, unspecified: Secondary | ICD-10-CM | POA: Diagnosis not present

## 2023-03-16 DIAGNOSIS — E119 Type 2 diabetes mellitus without complications: Secondary | ICD-10-CM | POA: Diagnosis not present

## 2023-03-16 DIAGNOSIS — I10 Essential (primary) hypertension: Secondary | ICD-10-CM | POA: Diagnosis not present

## 2023-03-16 DIAGNOSIS — Z87891 Personal history of nicotine dependence: Secondary | ICD-10-CM | POA: Diagnosis not present

## 2023-03-16 DIAGNOSIS — G2581 Restless legs syndrome: Secondary | ICD-10-CM | POA: Diagnosis not present

## 2023-06-01 DIAGNOSIS — R519 Headache, unspecified: Secondary | ICD-10-CM | POA: Diagnosis not present

## 2023-07-11 DIAGNOSIS — N200 Calculus of kidney: Secondary | ICD-10-CM | POA: Diagnosis not present

## 2023-07-11 DIAGNOSIS — R93422 Abnormal radiologic findings on diagnostic imaging of left kidney: Secondary | ICD-10-CM | POA: Diagnosis not present

## 2023-07-12 DIAGNOSIS — I10 Essential (primary) hypertension: Secondary | ICD-10-CM | POA: Diagnosis not present

## 2023-07-12 DIAGNOSIS — N200 Calculus of kidney: Secondary | ICD-10-CM | POA: Diagnosis not present

## 2023-08-30 DIAGNOSIS — R7303 Prediabetes: Secondary | ICD-10-CM | POA: Diagnosis not present

## 2023-08-30 DIAGNOSIS — I10 Essential (primary) hypertension: Secondary | ICD-10-CM | POA: Diagnosis not present

## 2023-09-04 DIAGNOSIS — H1031 Unspecified acute conjunctivitis, right eye: Secondary | ICD-10-CM | POA: Diagnosis not present

## 2023-09-05 DIAGNOSIS — E7800 Pure hypercholesterolemia, unspecified: Secondary | ICD-10-CM | POA: Diagnosis not present

## 2023-09-05 DIAGNOSIS — E669 Obesity, unspecified: Secondary | ICD-10-CM | POA: Diagnosis not present

## 2023-09-05 DIAGNOSIS — R7303 Prediabetes: Secondary | ICD-10-CM | POA: Diagnosis not present

## 2023-09-05 DIAGNOSIS — R609 Edema, unspecified: Secondary | ICD-10-CM | POA: Diagnosis not present

## 2023-09-05 DIAGNOSIS — I10 Essential (primary) hypertension: Secondary | ICD-10-CM | POA: Diagnosis not present

## 2023-09-05 DIAGNOSIS — Z0000 Encounter for general adult medical examination without abnormal findings: Secondary | ICD-10-CM | POA: Diagnosis not present

## 2023-09-18 DIAGNOSIS — I639 Cerebral infarction, unspecified: Secondary | ICD-10-CM | POA: Diagnosis not present

## 2023-09-18 DIAGNOSIS — R29704 NIHSS score 4: Secondary | ICD-10-CM | POA: Diagnosis not present

## 2023-09-18 DIAGNOSIS — R471 Dysarthria and anarthria: Secondary | ICD-10-CM | POA: Diagnosis not present

## 2023-09-18 DIAGNOSIS — Z882 Allergy status to sulfonamides status: Secondary | ICD-10-CM | POA: Diagnosis not present

## 2023-09-18 DIAGNOSIS — Z1152 Encounter for screening for COVID-19: Secondary | ICD-10-CM | POA: Diagnosis not present

## 2023-09-18 DIAGNOSIS — I10 Essential (primary) hypertension: Secondary | ICD-10-CM | POA: Diagnosis not present

## 2023-09-18 DIAGNOSIS — Z79899 Other long term (current) drug therapy: Secondary | ICD-10-CM | POA: Diagnosis not present

## 2023-09-18 DIAGNOSIS — R262 Difficulty in walking, not elsewhere classified: Secondary | ICD-10-CM | POA: Diagnosis not present

## 2023-09-18 DIAGNOSIS — Z7982 Long term (current) use of aspirin: Secondary | ICD-10-CM | POA: Diagnosis not present

## 2023-09-18 DIAGNOSIS — R42 Dizziness and giddiness: Secondary | ICD-10-CM | POA: Diagnosis not present

## 2023-09-18 DIAGNOSIS — G2581 Restless legs syndrome: Secondary | ICD-10-CM | POA: Diagnosis not present

## 2023-09-18 DIAGNOSIS — R4182 Altered mental status, unspecified: Secondary | ICD-10-CM | POA: Diagnosis not present

## 2023-09-18 DIAGNOSIS — E119 Type 2 diabetes mellitus without complications: Secondary | ICD-10-CM | POA: Diagnosis not present

## 2023-09-18 DIAGNOSIS — Z7902 Long term (current) use of antithrombotics/antiplatelets: Secondary | ICD-10-CM | POA: Diagnosis not present

## 2023-09-18 DIAGNOSIS — E785 Hyperlipidemia, unspecified: Secondary | ICD-10-CM | POA: Diagnosis not present

## 2023-09-18 DIAGNOSIS — F17210 Nicotine dependence, cigarettes, uncomplicated: Secondary | ICD-10-CM | POA: Diagnosis not present

## 2023-09-18 DIAGNOSIS — Z8673 Personal history of transient ischemic attack (TIA), and cerebral infarction without residual deficits: Secondary | ICD-10-CM | POA: Diagnosis not present

## 2023-09-18 DIAGNOSIS — Z90710 Acquired absence of both cervix and uterus: Secondary | ICD-10-CM | POA: Diagnosis not present

## 2023-09-18 DIAGNOSIS — I6381 Other cerebral infarction due to occlusion or stenosis of small artery: Secondary | ICD-10-CM | POA: Diagnosis not present

## 2023-09-27 DIAGNOSIS — R7303 Prediabetes: Secondary | ICD-10-CM | POA: Diagnosis not present

## 2023-09-27 DIAGNOSIS — E7800 Pure hypercholesterolemia, unspecified: Secondary | ICD-10-CM | POA: Diagnosis not present

## 2023-09-27 DIAGNOSIS — Z8673 Personal history of transient ischemic attack (TIA), and cerebral infarction without residual deficits: Secondary | ICD-10-CM | POA: Diagnosis not present

## 2023-09-27 DIAGNOSIS — E669 Obesity, unspecified: Secondary | ICD-10-CM | POA: Diagnosis not present

## 2023-11-22 DIAGNOSIS — R7303 Prediabetes: Secondary | ICD-10-CM | POA: Diagnosis not present

## 2023-11-22 DIAGNOSIS — E7800 Pure hypercholesterolemia, unspecified: Secondary | ICD-10-CM | POA: Diagnosis not present

## 2023-11-23 DIAGNOSIS — H2511 Age-related nuclear cataract, right eye: Secondary | ICD-10-CM | POA: Diagnosis not present

## 2023-11-23 DIAGNOSIS — H2512 Age-related nuclear cataract, left eye: Secondary | ICD-10-CM | POA: Diagnosis not present

## 2023-11-23 DIAGNOSIS — H43813 Vitreous degeneration, bilateral: Secondary | ICD-10-CM | POA: Diagnosis not present

## 2023-11-29 DIAGNOSIS — R609 Edema, unspecified: Secondary | ICD-10-CM | POA: Diagnosis not present

## 2023-11-29 DIAGNOSIS — R7303 Prediabetes: Secondary | ICD-10-CM | POA: Diagnosis not present

## 2023-11-29 DIAGNOSIS — E7800 Pure hypercholesterolemia, unspecified: Secondary | ICD-10-CM | POA: Diagnosis not present

## 2023-11-29 DIAGNOSIS — I10 Essential (primary) hypertension: Secondary | ICD-10-CM | POA: Diagnosis not present

## 2023-12-26 DIAGNOSIS — I639 Cerebral infarction, unspecified: Secondary | ICD-10-CM | POA: Diagnosis not present

## 2024-01-08 DIAGNOSIS — M5412 Radiculopathy, cervical region: Secondary | ICD-10-CM | POA: Diagnosis not present

## 2024-01-08 DIAGNOSIS — M79602 Pain in left arm: Secondary | ICD-10-CM | POA: Diagnosis not present

## 2024-01-08 DIAGNOSIS — M899 Disorder of bone, unspecified: Secondary | ICD-10-CM | POA: Diagnosis not present

## 2024-02-27 DIAGNOSIS — E7800 Pure hypercholesterolemia, unspecified: Secondary | ICD-10-CM | POA: Diagnosis not present

## 2024-02-27 DIAGNOSIS — R7303 Prediabetes: Secondary | ICD-10-CM | POA: Diagnosis not present

## 2024-02-27 DIAGNOSIS — I10 Essential (primary) hypertension: Secondary | ICD-10-CM | POA: Diagnosis not present

## 2024-03-07 DIAGNOSIS — G8929 Other chronic pain: Secondary | ICD-10-CM | POA: Diagnosis not present

## 2024-03-07 DIAGNOSIS — R7303 Prediabetes: Secondary | ICD-10-CM | POA: Diagnosis not present

## 2024-03-07 DIAGNOSIS — M25511 Pain in right shoulder: Secondary | ICD-10-CM | POA: Diagnosis not present

## 2024-03-07 DIAGNOSIS — I10 Essential (primary) hypertension: Secondary | ICD-10-CM | POA: Diagnosis not present

## 2024-03-07 DIAGNOSIS — R609 Edema, unspecified: Secondary | ICD-10-CM | POA: Diagnosis not present

## 2024-03-07 DIAGNOSIS — E7800 Pure hypercholesterolemia, unspecified: Secondary | ICD-10-CM | POA: Diagnosis not present

## 2024-03-23 DIAGNOSIS — M75101 Unspecified rotator cuff tear or rupture of right shoulder, not specified as traumatic: Secondary | ICD-10-CM | POA: Diagnosis not present

## 2024-03-23 DIAGNOSIS — M19011 Primary osteoarthritis, right shoulder: Secondary | ICD-10-CM | POA: Diagnosis not present

## 2024-03-30 NOTE — Progress Notes (Signed)
 [facility] Quality Team Note  Name: Laurie Dickson Date of Birth: 26-Feb-1951 MRN: [id number] Date: 03/30/2024  [facility] Quality Team has reviewed this patient's chart, please see recommendations below:  [facility] Quality Other; (Chart reviewed for TRC measure. Sent to [place])

## 2024-09-12 ENCOUNTER — Other Ambulatory Visit: Payer: Self-pay | Admitting: Internal Medicine

## 2024-09-12 DIAGNOSIS — Z1231 Encounter for screening mammogram for malignant neoplasm of breast: Secondary | ICD-10-CM

## 2024-10-08 ENCOUNTER — Encounter
